# Patient Record
Sex: Female | Born: 1985 | Race: Black or African American | Hispanic: No | Marital: Married | Smoking: Never smoker
Health system: Southern US, Community
[De-identification: ages and names within clinical notes are randomized; demographics above are authoritative.]

## PROBLEM LIST (undated history)

## (undated) DIAGNOSIS — M3500 Sicca syndrome, unspecified: Secondary | ICD-10-CM

## (undated) DIAGNOSIS — M419 Scoliosis, unspecified: Secondary | ICD-10-CM

## (undated) DIAGNOSIS — K589 Irritable bowel syndrome without diarrhea: Secondary | ICD-10-CM

## (undated) DIAGNOSIS — D573 Sickle-cell trait: Secondary | ICD-10-CM

## (undated) DIAGNOSIS — E2839 Other primary ovarian failure: Secondary | ICD-10-CM

## (undated) DIAGNOSIS — O99019 Anemia complicating pregnancy, unspecified trimester: Secondary | ICD-10-CM

## (undated) DIAGNOSIS — D649 Anemia, unspecified: Secondary | ICD-10-CM

## (undated) DIAGNOSIS — F32A Depression, unspecified: Secondary | ICD-10-CM

## (undated) HISTORY — DX: Depression, unspecified: F32.A

## (undated) HISTORY — DX: Anemia, unspecified: D64.9

## (undated) HISTORY — PX: WISDOM TOOTH EXTRACTION: SHX21

## (undated) HISTORY — DX: Sjogren syndrome, unspecified: M35.00

## (undated) HISTORY — DX: Irritable bowel syndrome, unspecified: K58.9

## (undated) HISTORY — PX: SPINE SURGERY: SHX786

## (undated) HISTORY — DX: Scoliosis, unspecified: M41.9

## (undated) HISTORY — DX: Anemia complicating pregnancy, unspecified trimester: O99.019

## (undated) HISTORY — DX: Anemia complicating pregnancy, unspecified trimester: D57.3

## (undated) HISTORY — DX: Other primary ovarian failure: E28.39

---

## 2021-08-14 ENCOUNTER — Telehealth: Payer: Self-pay

## 2021-08-14 NOTE — Telephone Encounter (Signed)
Mar/LM for patient (to schedule MFM referral rec'd).

## 2021-08-21 ENCOUNTER — Other Ambulatory Visit: Payer: Self-pay

## 2021-08-28 ENCOUNTER — Telehealth: Payer: Self-pay

## 2021-08-28 NOTE — Telephone Encounter (Signed)
Mar/LM for patient requesting c/b to schedule MFM Consult & Gen Couns referral.

## 2021-08-28 NOTE — Telephone Encounter (Signed)
MAR/PATIENT CALLED BACK IN TO ADVISE SHE DOES NOT WISH TO SCHEDULE AND STATED TO PLS CANCEL THIS REFERRAL.

## 2021-08-29 ENCOUNTER — Telehealth: Payer: Self-pay

## 2021-08-29 NOTE — Telephone Encounter (Signed)
sw office and advised patient declined to schedule MFM referral.

## 2021-09-11 ENCOUNTER — Other Ambulatory Visit: Payer: Self-pay | Admitting: Obstetrics and Gynecology

## 2021-09-11 DIAGNOSIS — Z3689 Encounter for other specified antenatal screening: Secondary | ICD-10-CM

## 2021-09-12 ENCOUNTER — Other Ambulatory Visit: Payer: Self-pay | Admitting: Obstetrics and Gynecology

## 2021-09-12 DIAGNOSIS — O26879 Cervical shortening, unspecified trimester: Secondary | ICD-10-CM

## 2021-09-13 ENCOUNTER — Other Ambulatory Visit: Payer: Self-pay

## 2021-09-13 ENCOUNTER — Other Ambulatory Visit: Payer: Self-pay | Admitting: Obstetrics and Gynecology

## 2021-09-13 ENCOUNTER — Ambulatory Visit: Payer: 59 | Admitting: *Deleted

## 2021-09-13 ENCOUNTER — Ambulatory Visit: Payer: 59 | Attending: Obstetrics and Gynecology

## 2021-09-13 ENCOUNTER — Ambulatory Visit (HOSPITAL_BASED_OUTPATIENT_CLINIC_OR_DEPARTMENT_OTHER): Payer: 59 | Admitting: Obstetrics and Gynecology

## 2021-09-13 VITALS — BP 122/82 | HR 92

## 2021-09-13 DIAGNOSIS — Z3A22 22 weeks gestation of pregnancy: Secondary | ICD-10-CM | POA: Insufficient documentation

## 2021-09-13 DIAGNOSIS — O26879 Cervical shortening, unspecified trimester: Secondary | ICD-10-CM | POA: Insufficient documentation

## 2021-09-13 DIAGNOSIS — O09812 Supervision of pregnancy resulting from assisted reproductive technology, second trimester: Secondary | ICD-10-CM | POA: Insufficient documentation

## 2021-09-13 DIAGNOSIS — O26872 Cervical shortening, second trimester: Secondary | ICD-10-CM

## 2021-09-13 DIAGNOSIS — O30049 Twin pregnancy, dichorionic/diamniotic, unspecified trimester: Secondary | ICD-10-CM | POA: Insufficient documentation

## 2021-09-13 DIAGNOSIS — O3432 Maternal care for cervical incompetence, second trimester: Secondary | ICD-10-CM | POA: Insufficient documentation

## 2021-09-13 DIAGNOSIS — Z3689 Encounter for other specified antenatal screening: Secondary | ICD-10-CM | POA: Diagnosis present

## 2021-09-13 DIAGNOSIS — O30042 Twin pregnancy, dichorionic/diamniotic, second trimester: Secondary | ICD-10-CM

## 2021-09-13 NOTE — Progress Notes (Signed)
Maternal-Fetal Medicine   Name: Makayla [name] DOB: 1986/06/05 MRN: [id number] Referring Provider: Huel Cote, MD  I had the pleasure of seeing Makay[facility] [name] today at [facility]. She was accompanied by her husband. She is G1 P0 at 22w 1d gestation with twin pregnancy. At your office ultrasound performed on 09/05/21, cervical shortening (1.7 cm) was seen.  Rest of the fetal anatomical survey was reported as normal.  Patient reports she has been taking vaginal progesterone since the diagnosis of cervical insufficiency.  She does not have vaginal bleeding.  Patient conceived by IVF and 2 embryos were transferred.  GYN history: No history of abnormal Pap smears or cervical surgeries. Past medical history: Patient has Sjogren's syndrome. No history of diabetes or hypertension or any chronic medical conditions. She has sickle-cell trait. Social history: Denies tobacco or drug or alcohol use.  She has been married 3 years and her husband is in good health. Prenatal: Patient had initial prenatal care at [facility].  She reports screening for aneuploidies were inconclusive because of low fetal fraction.    Ultrasound We did not perform detailed fetal anatomical survey.  A limited ultrasound study was performed. We confirmed a dichorionic-diamniotic twin pregnancy.  Twin A: Lower fetus, cephalic presentation, anterior placenta, female fetus.  Amniotic fluid is normal good fetal activity seen.  Twin B: Upper fetus, transverse lie and head to maternal left, posterior placenta, female fetus.  Amniotic fluid is normal good fetal activity seen.    We performed a transvaginal ultrasound to evaluate the cervix.  The shortest and best resting cervical length measurement was 1.6 cm.  On transfundal pressure, the cervix shortened to 3 millimeters. Funneling was seen. After explaining, I performed a sterile speculum examination.  The external os is closed.  Vagina and cervix  appear normal.  On digital examination, the cervix is 1.5 cm long and the external os is closed.  Cervical insufficiency -I explained the findings with help of ultrasound images and diagrams.  Significant cervical shortening was seen on trendsfundal pressure.  I discussed the benefit of vaginal progesterone that has been shown to reduce the likelihood of preterm labor in singletons.    I informed that short cervix is associated with increased risk of mid-trimester pregnancy loss or preterm delivery and that both twin pregnancy and short cervix are risk factors for preterm delivery. Patient does not have symptoms of pelvic pressure or uterine contractions.  In singleton pregnancies, vaginal progesterone treatment women with short cervix and with no history of preterm delivery have shown to decrease preterm delivery rates. However, there is no clear evidence of its benefit in twin pregnancies. A meta-analysis (2015) from 13 trials in women with twin pregnancies with short cervix, perinatal outcome was better in the group that received vaginal progesterone.   Based on the studies that showed possible reduction in preterm delivery with vaginal progesterone treatment.  Till recently, we have been counseling patient that cerclage is not indicated. Limited studies have shown that cerclage does not confer benefit in prolonging the pregnancy and in some cases increase the risk of miscarriage. However, smaller studies have shown some benefit of rescue cerclage in prolongation of pregnancy. A recent metaanalysis showed that placement of rescue cerclage with dilated cervix greater than 10 millimeters showed prolongation of pregnancy by more than 6 weeks (Chunbo et al. Hubbard Robinson June 2019, pp. [phone]).  I counseled her that rescue cerclage is an option.  I explained the procedure and possible complications including  bleeding, infection, miscarriage and injuries to bladder or bowel (rare).  Continuation of vaginal  progesterone without cerclage is also a reasonable option.  Patient understands that cerclage does not guarantee carrying pregnancy to term. After counseling, the patient opted to have rescue cerclage procedure.  I counseled her that vaginal progesterone should be continued after cerclage.  I have not counseled her on twin pregnancy or Sjogren's syndrome in pregnancy. I discussed the findings and patient's request of rescue cerclage with Dr. Senaida Ores.  She will be arranging for the patient to have rescue cerclage procedure.  Recommendations -Rescue cerclage. -Follow-up ultrasound at your office.  Thank you for consultation.  If you have any questions or concerns, please contact me the [facility].  Consultation including face-to-face (more than 50%) counseling 30 minutes.

## 2021-09-15 NOTE — H&P (Signed)
Makayla [name] is an 36 y.o.prime female presenting at 45 4/7wks for a rescue cerclage.  Pt is dated per IVF transfer ( Dx with premature ovarian failure as a teenager). Carrying di -di twins. She begun care in our practice at [redacted]weeks gestation. Pt was noted to have a cervical length of 1.7cm at anatomy scan on 09/05/21. A repeat scan on 09/13/21 with MFM noted same with funneling to 24mm with pressure.  Pt was counseled extensively by MFM and subsequently by Dr Marvel Plan ( doc on call) and myself ( as I would be performing procedure).  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   Pertinent Gynecological History: Menses:  pregnant Bleeding: n/a OB History: G1, P0   Menstrual History: Menarche age: 21 Patient's last menstrual period was 04/25/2021.    Past Medical History:  Diagnosis Date   Anemia    Depression    IBS (irritable bowel syndrome)    Ovarian failure    Scoliosis    Sickle cell trait in mother affecting pregnancy ([place])    Sjogren's disease ([place])     Past Surgical History:  Procedure Laterality Date   SPINE SURGERY     WISDOM TOOTH EXTRACTION      Family History  Problem Relation Age of Onset   Hypertension Mother    Diabetes Mother    Bell's palsy Father    Hypertension Father     Social History:  reports that she has never smoked. She has never used smokeless tobacco. She reports that she does not currently use alcohol. She reports that she does not use drugs.  Allergies: No Known Allergies  No medications prior to admission.    Review of Systems  Constitutional:  Negative for activity change and fatigue.  Eyes:  Negative for photophobia and visual disturbance.  Gastrointestinal:  Negative for abdominal pain.  Genitourinary:  Negative for pelvic pain and urgency.  Musculoskeletal:  Negative for myalgias.   Psychiatric/Behavioral:  The patient is not nervous/anxious.    Last menstrual period 04/25/2021. Physical Exam Constitutional:      Appearance: Normal appearance.  Pulmonary:     Effort: Pulmonary effort is normal.  Musculoskeletal:        General: Normal range of motion.     Cervical back: Normal range of motion.  Skin:    General: Skin is warm.     Capillary Refill: Capillary refill takes 2 to 3 seconds.  Neurological:     General: No focal deficit present.     Mental Status: She is alert and oriented to person, place, and time.  Psychiatric:        Mood and Affect: Mood normal.        Behavior: Behavior normal.    No results found for this or any previous visit (from the past 24 hour(s)).  [place] MFM OB Transvaginal  Result Date: 09/13/2021 ----------------------------------------------------------------------  OBSTETRICS REPORT                       (Signed Final 09/13/2021 04:46 pm) ---------------------------------------------------------------------- Patient Info  ID #:       [id number]                          D.O.B.:  22-Aug-1986 (35 yrs)  Name:       Matthew Folks  Visit Date: 09/13/2021 12:55 pm ---------------------------------------------------------------------- Performed By  Attending:        Tama High MD        Ref. Address:     [address]                                                             [address] [place], [place]  Performed By:     Jeanene Erb BS,      [facility]                                                              [facility]  Referred By:      Paula Compton MD ---------------------------------------------------------------------- Orders  #  Description                           Code        Ordered By  1  [place] MFM OB LIMITED                     76815.01    [place]  2  [place] MFM OB TRANSVAGINAL                [phone]     KATHY RICHARDSON ----------------------------------------------------------------------  #  Order #                     Accession #                Episode #  1  [id number]                   [id number]                 [id number]  2  [id number]                   [id number]                 [id number] ---------------------------------------------------------------------- Indications  Twin pregnancy, di/di, second trimester        O30.042  Cervical incompetence, second trimester        O34.32  [redacted] weeks gestation of pregnancy                Z3A.22  Encounter for antenatal screening for          Z36.3  malformations  Genetic carrier Network engineer)                   Z14.8  Pregnancy resulting from assisted              O43.819  reproductive technology ---------------------------------------------------------------------- Fetal Evaluation (Fetus A)  Num Of Fetuses:         2  Fetal Heart Rate(bpm):  153  Cardiac Activity:       Observed  Fetal Lie:              Lower Fetus  Presentation:           Cephalic  Placenta:               Anterior  P. Cord Insertion:      Visualized  Membrane Desc:      Dividing Membrane seen - Dichorionic.  Amniotic Fluid  AFI FV:      Within normal limits                              Largest Pocket(cm)                              5.3 ---------------------------------------------------------------------- OB History  Gravidity:    1 ---------------------------------------------------------------------- Gestational Age (Fetus A)  LMP:           20w 1d        Date:  04/25/21  EDD:    01/30/22  Best:          22w 1d     Det. ByLoman Chroman         EDD:   01/16/22                                      (08/07/21) ---------------------------------------------------------------------- Anatomy (Fetus A)  Diaphragm:             Appears normal         Abdomen:                Appears normal  Stomach:               Appears normal, left   Bladder:                Appears normal                         sided ---------------------------------------------------------------------- Fetal Evaluation (Fetus B)  Num Of Fetuses:         2  Fetal Heart Rate(bpm):  141  Cardiac Activity:       Observed  Fetal Lie:              Upper Fetus  Presentation:           Transverse, head to maternal left  Placenta:               Posterior  P. Cord Insertion:      Visualized  Membrane Desc:      Dividing Membrane seen - Dichorionic.  Amniotic Fluid  AFI FV:      Within normal limits                              Largest Pocket(cm)                              8.5 ---------------------------------------------------------------------- Gestational Age (Fetus B)  LMP:           20w 1d        Date:  04/25/21                 EDD:   01/30/22  Best:          22w 1d     Det. By:  Loman Chroman         EDD:   01/16/22                                      (08/07/21) ---------------------------------------------------------------------- Anatomy (Fetus B)  Diaphragm:             Appears normal         Abdomen:                Appears normal  Stomach:               Appears normal, left   Bladder:                Appears normal  sided ---------------------------------------------------------------------- Cervix Uterus Adnexa  Cervix  Length:            0.3  cm.  Measured transvaginally. ---------------------------------------------------------------------- Impression  We did not perform detailed fetal anatomical survey.  A  limited ultrasound study was performed.  We confirmed a dichorionic-diamniotic twin  pregnancy.  Twin A: Lower fetus, cephalic presentation, anterior placenta,  female fetus.  Amniotic fluid is normal good fetal activity seen.  Twin B: Upper fetus, transverse lie and head to maternal left,  posterior placenta, female fetus.  Amniotic fluid is normal good  fetal activity seen.  We performed a transvaginal ultrasound to evaluate the  cervix.  The shortest and best resting cervical length  measurement was 1.6 cm.  On transfundal pressure, the  cervix shortened to 3 millimeters. Funneling was seen.  After explaining, I performed a sterile speculum examination.  The external os is closed.  Vagina and cervix appear normal.  On digital examination, the cervix is 1.5 cm long and the  external os is closed.  xxxxxxxxxxxxxxxxxxxxxxxxxxxxxxxxxxxxxxxxxxxxxxxxxxxxxxxxx  xx  Consultation (see EPIC )  I had the pleasure of seeing Ms. Cremeans today at the [facility]. She was accompanied by her  husband. She is G1 P0 at 22w 1d gestation with twin  pregnancy. At your office ultrasound performed on 09/05/21,  cervical shortening (1.7 cm) was seen.  Rest of the fetal  anatomical survey was reported as normal.  Patient reports she has been taking vaginal progesterone  since the diagnosis of cervical insufficiency.  She does not  have vaginal bleeding.  Patient conceived by IVF and 2 embryos were transferred.  GYN history: No history of abnormal Pap smears or cervical  surgeries.  Past medical history: Patient has Sjogren's syndrome. No  history of diabetes or hypertension or any chronic medical  conditions. She has sickle-cell trait.  Social history: Denies tobacco or drug or alcohol use.  She  has been married 3 years and her husband is in good health.  Prenatal: Patient had initial prenatal care at [facility].  She reports screening for aneuploidies were  inconclusive because of low fetal fraction.  Cervical insufficiency  -I explained the findings with help of ultrasound images and   diagrams.  Significant cervical shortening was seen on  trendsfundal pressure.  I discussed the benefit of vaginal  progesterone that has been shown to reduce the likelihood of  preterm labor in singletons.  I informed that short cervix is associated with increased risk  of mid-trimester pregnancy loss or preterm delivery and that  both twin pregnancy and short cervix are risk factors for  preterm delivery. Patient does not have symptoms of pelvic  pressure or uterine contractions.  In singleton pregnancies, vaginal progesterone treatment  women with short cervix and with no history of preterm  delivery have shown to decrease preterm delivery rates.  However, there is no clear evidence of its benefit in twin  pregnancies. A meta-analysis (2015) from 13 trials in women  with twin pregnancies with short cervix, perinatal outcome  was better in the group that received vaginal progesterone.  Based on the studies that showed possible reduction in  preterm delivery with vaginal progesterone treatment.  Till recently, we have been counseling patient that cerclage is  not indicated. Limited studies have shown that cerclage does  not confer benefit in prolonging the pregnancy and in some  cases increase the risk of miscarriage. However, smaller  studies have shown some  benefit of rescue cerclage in  prolongation of pregnancy. A recent metaanalysis showed  that placement of rescue cerclage with dilated cervix greater  than 10 millimeters showed prolongation of pregnancy by  more than 6 weeks (Chunbo et al. Dicie Beam June 2019, pp. 543-  557).  I counseled her that rescue cerclage is an option.  I explained  the procedure and possible complications including bleeding,  infection, miscarriage and injuries to bladder or bowel (rare).  Continuation of vaginal progesterone without cerclage is also  a reasonable option.  Patient understands that cerclage does not guarantee  carrying pregnancy to term.  After counseling, the patient  opted to have rescue cerclage  procedure.  I counseled her that vaginal progesterone should  be continued after cerclage.  I have not counseled her on twin pregnancy or Sjogren's  syndrome in pregnancy.  I discussed the findings and patient's request of rescue  cerclage with Dr. Marvel Plan.  She will be arranging for the  patient to have rescue cerclage procedure. ---------------------------------------------------------------------- Recommendations  -Rescue cerclage.  -Follow-up ultrasound at your office. ----------------------------------------------------------------------                  Tama High, MD Electronically Signed Final Report   09/13/2021 04:46 pm ----------------------------------------------------------------------  [place] MFM OB LIMITED  Result Date: 09/13/2021 ----------------------------------------------------------------------  OBSTETRICS REPORT                       (Signed Final 09/13/2021 04:46 pm) ---------------------------------------------------------------------- Patient Info  ID #:       [id number]                          D.O.B.:  1986-06-21 (35 yrs)  Name:       MAKAYLLA SANTORO                 Visit Date: 09/13/2021 12:55 pm ---------------------------------------------------------------------- Performed By  Attending:        Tama High MD        Ref. Address:     [address],  [address] [place], [place]  Performed By:     Jeanene Erb BS,      [facility]                                     [facility] at                                                              [place] [facility]                                                             [facility]  Referred By:      Paula Compton MD ---------------------------------------------------------------------- Orders  #  Description                           Code        Ordered By  1  [place] MFM OB LIMITED                     76815.01    [place]  2  [place] MFM OB TRANSVAGINAL                [id number]     KATHY RICHARDSON ----------------------------------------------------------------------  #  Order #                     Accession #                Episode #  1  [id number]                   [id number]  2  [id number]                   [id number]                 [id number] ---------------------------------------------------------------------- Indications  Twin pregnancy,  di/di, second trimester        O30.042  Cervical incompetence, second trimester        O34.32  [redacted] weeks gestation of pregnancy                Z3A.22  Encounter for antenatal screening for          Z36.3  malformations  Genetic carrier Network engineer)                   Z14.8  Pregnancy resulting from assisted              O71.819  reproductive technology ---------------------------------------------------------------------- Fetal Evaluation (Fetus A)  Num Of Fetuses:         2  Fetal Heart Rate(bpm):  153  Cardiac Activity:       Observed  Fetal Lie:              Lower Fetus  Presentation:           Cephalic  Placenta:               Anterior  P. Cord Insertion:      Visualized  Membrane Desc:      Dividing Membrane seen - Dichorionic.  Amniotic Fluid  AFI FV:      Within normal limits                              Largest Pocket(cm)                              5.3 ---------------------------------------------------------------------- OB History  Gravidity:    1 ---------------------------------------------------------------------- Gestational Age (Fetus A)  LMP:           20w 1d        Date:  04/25/21                  EDD:   01/30/22  Best:          22w 1d     Det. ByLoman Chroman         EDD:   01/16/22                                      (08/07/21) ---------------------------------------------------------------------- Anatomy (Fetus A)  Diaphragm:             Appears normal         Abdomen:                Appears normal  Stomach:               Appears normal, left   Bladder:                Appears normal                         sided ---------------------------------------------------------------------- Fetal Evaluation (Fetus B)  Num Of Fetuses:         2  Fetal Heart Rate(bpm):  141  Cardiac Activity:       Observed  Fetal Lie:              Upper Fetus  Presentation:           Transverse, head to maternal left  Placenta:               Posterior  P. Cord Insertion:      Visualized  Membrane Desc:      Dividing Membrane seen - Dichorionic.  Amniotic Fluid  AFI FV:      Within normal limits                              Largest Pocket(cm)                              8.5 ---------------------------------------------------------------------- Gestational Age (Fetus B)  LMP:           20w 1d        Date:  04/25/21                 EDD:   01/30/22  Best:          22w 1d     Det. By:  Loman Chroman         EDD:   01/16/22                                      (08/07/21) ---------------------------------------------------------------------- Anatomy (Fetus B)  Diaphragm:             Appears normal         Abdomen:                Appears normal  Stomach:               Appears normal, left   Bladder:                Appears normal                         sided ---------------------------------------------------------------------- Cervix Uterus Adnexa  Cervix  Length:            0.3  cm.  Measured transvaginally. ---------------------------------------------------------------------- Impression  We did not perform detailed fetal anatomical survey.  A  limited ultrasound study was performed.  We confirmed a  dichorionic-diamniotic twin pregnancy.  Twin A: Lower fetus, cephalic presentation, anterior placenta,  female fetus.  Amniotic fluid is normal good fetal activity seen.  Twin B: Upper fetus, transverse lie and head to maternal left,  posterior placenta, female fetus.  Amniotic fluid is normal good  fetal activity seen.  We performed a transvaginal ultrasound to evaluate the  cervix.  The shortest and best resting cervical length  measurement was 1.6 cm.  On transfundal pressure, the  cervix shortened to 3 millimeters. Funneling was seen.  After explaining, I performed a sterile speculum examination.  The external os is closed.  Vagina and cervix appear normal.  On digital examination, the cervix is 1.5 cm long and the  external os is closed.  xxxxxxxxxxxxxxxxxxxxxxxxxxxxxxxxxxxxxxxxxxxxxxxxxxxxxxxxx  xx  Consultation (see EPIC )  I had the pleasure of seeing Ms. Gorley today at the [facility]. She was accompanied by her  husband. She is G1 P0 at 22w 1d gestation with twin  pregnancy. At your office ultrasound performed on 09/05/21,  cervical shortening (1.7 cm) was seen.  Rest of the fetal  anatomical survey was reported as normal.  Patient reports she has been taking  vaginal progesterone  since the diagnosis of cervical insufficiency.  She does not  have vaginal bleeding.  Patient conceived by IVF and 2 embryos were transferred.  GYN history: No history of abnormal Pap smears or cervical  surgeries.  Past medical history: Patient has Sjogren's syndrome. No  history of diabetes or hypertension or any chronic medical  conditions. She has sickle-cell trait.  Social history: Denies tobacco or drug or alcohol use.  She  has been married 3 years and her husband is in good health.  Prenatal: Patient had initial prenatal care at [facility].  She reports screening for aneuploidies were  inconclusive because of low fetal fraction.  Cervical insufficiency  -I explained the findings with help of  ultrasound images and  diagrams.  Significant cervical shortening was seen on  trendsfundal pressure.  I discussed the benefit of vaginal  progesterone that has been shown to reduce the likelihood of  preterm labor in singletons.  I informed that short cervix is associated with increased risk  of mid-trimester pregnancy loss or preterm delivery and that  both twin pregnancy and short cervix are risk factors for  preterm delivery. Patient does not have symptoms of pelvic  pressure or uterine contractions.  In singleton pregnancies, vaginal progesterone treatment  women with short cervix and with no history of preterm  delivery have shown to decrease preterm delivery rates.  However, there is no clear evidence of its benefit in twin  pregnancies. A meta-analysis (2015) from 13 trials in women  with twin pregnancies with short cervix, perinatal outcome  was better in the group that received vaginal progesterone.  Based on the studies that showed possible reduction in  preterm delivery with vaginal progesterone treatment.  Till recently, we have been counseling patient that cerclage is  not indicated. Limited studies have shown that cerclage does  not confer benefit in prolonging the pregnancy and in some  cases increase the risk of miscarriage. However, smaller  studies have shown some benefit of rescue cerclage in  prolongation of pregnancy. A recent metaanalysis showed  that placement of rescue cerclage with dilated cervix greater  than 10 millimeters showed prolongation of pregnancy by  more than 6 weeks (Chunbo et al. Dicie Beam June 2019, pp. 543-  557).  I counseled her that rescue cerclage is an option.  I explained  the procedure and possible complications including bleeding,  infection, miscarriage and injuries to bladder or bowel (rare).  Continuation of vaginal progesterone without cerclage is also  a reasonable option.  Patient understands that cerclage does not guarantee  carrying pregnancy to term.  After  counseling, the patient opted to have rescue cerclage  procedure.  I counseled her that vaginal progesterone should  be continued after cerclage.  I have not counseled her on twin pregnancy or Sjogren's  syndrome in pregnancy.  I discussed the findings and patient's request of rescue  cerclage with Dr. Marvel Plan.  She will be arranging for the  patient to have rescue cerclage procedure. ---------------------------------------------------------------------- Recommendations  -Rescue cerclage.  -Follow-up ultrasound at your office. ----------------------------------------------------------------------                  Tama High, MD Electronically Signed Final Report   09/13/2021 04:46 pm ----------------------------------------------------------------------   Assessment/Plan: 35yo Prime at 13 4/7wks per IVF transfer date with incompetent cervix here for rescue cerclage - Check FHTs prior to and after procedure - ERAS protocol - Risks reviewed and consent obtained   Aylah Yeary W  Rayhaan Huster 09/15/2021, 1:20 PM

## 2021-09-16 ENCOUNTER — Encounter (HOSPITAL_COMMUNITY): Payer: Self-pay | Admitting: Obstetrics and Gynecology

## 2021-09-16 ENCOUNTER — Encounter (HOSPITAL_COMMUNITY)
Admission: AD | Disposition: A | Discharge status: Home / Self Care | Payer: Self-pay | Source: Home / Self Care | Attending: Obstetrics and Gynecology

## 2021-09-16 ENCOUNTER — Inpatient Hospital Stay (HOSPITAL_COMMUNITY): Payer: 59 | Admitting: Anesthesiology

## 2021-09-16 ENCOUNTER — Ambulatory Visit (HOSPITAL_COMMUNITY)
Admission: AD | Admit: 2021-09-16 | Discharge: 2021-09-16 | Disposition: A | Discharge status: Home / Self Care | Payer: 59 | Attending: Obstetrics and Gynecology | Admitting: Obstetrics and Gynecology

## 2021-09-16 ENCOUNTER — Other Ambulatory Visit: Payer: Self-pay

## 2021-09-16 DIAGNOSIS — O30042 Twin pregnancy, dichorionic/diamniotic, second trimester: Secondary | ICD-10-CM | POA: Diagnosis not present

## 2021-09-16 DIAGNOSIS — O99012 Anemia complicating pregnancy, second trimester: Secondary | ICD-10-CM | POA: Insufficient documentation

## 2021-09-16 DIAGNOSIS — N883 Incompetence of cervix uteri: Secondary | ICD-10-CM

## 2021-09-16 DIAGNOSIS — Z3A22 22 weeks gestation of pregnancy: Secondary | ICD-10-CM | POA: Diagnosis not present

## 2021-09-16 DIAGNOSIS — M3500 Sicca syndrome, unspecified: Secondary | ICD-10-CM | POA: Diagnosis not present

## 2021-09-16 DIAGNOSIS — O3432 Maternal care for cervical incompetence, second trimester: Secondary | ICD-10-CM | POA: Insufficient documentation

## 2021-09-16 HISTORY — PX: CERVICAL CERCLAGE: SHX1329

## 2021-09-16 LAB — TYPE AND SCREEN
ABO/RH(D): O POS
Antibody Screen: NEGATIVE

## 2021-09-16 LAB — CBC
HCT: 33.6 % — ABNORMAL LOW (ref 36.0–46.0)
Hemoglobin: 11.1 g/dL — ABNORMAL LOW (ref 12.0–15.0)
MCH: 27 pg (ref 26.0–34.0)
MCHC: 33 g/dL (ref 30.0–36.0)
MCV: 81.8 fL (ref 80.0–100.0)
Platelets: 390 10*3/uL (ref 150–400)
RBC: 4.11 MIL/uL (ref 3.87–5.11)
RDW: 13.4 % (ref 11.5–15.5)
WBC: 7.9 10*3/uL (ref 4.0–10.5)
nRBC: 0 % (ref 0.0–0.2)

## 2021-09-16 SURGERY — CERCLAGE, CERVIX, VAGINAL APPROACH
Anesthesia: Spinal

## 2021-09-16 MED ORDER — ACETAMINOPHEN 10 MG/ML IV SOLN: 1000.0000 mg | Freq: Once | INTRAVENOUS | Status: DC | PRN

## 2021-09-16 MED ORDER — FENTANYL CITRATE (PF) 100 MCG/2ML IJ SOLN
INTRAMUSCULAR | Status: DC | PRN
  Administered 2021-09-16: 25 ug via INTRAVENOUS

## 2021-09-16 MED ORDER — LIDOCAINE 2% (20 MG/ML) 5 ML SYRINGE
INTRAMUSCULAR | Status: DC | PRN
  Administered 2021-09-16: 60 mg via INTRAVENOUS

## 2021-09-16 MED ORDER — ACETAMINOPHEN 500 MG PO TABS
ORAL_TABLET | ORAL | Status: AC
  Filled 2021-09-16: qty 2

## 2021-09-16 MED ORDER — PHENYLEPHRINE HCL (PRESSORS) 10 MG/ML IV SOLN
INTRAVENOUS | Status: DC | PRN
  Administered 2021-09-16: 40 ug via INTRAVENOUS

## 2021-09-16 MED ORDER — ACETAMINOPHEN 500 MG PO TABS
1000.0000 mg | ORAL_TABLET | ORAL | Status: AC
  Administered 2021-09-16: 1000 mg via ORAL

## 2021-09-16 MED ORDER — LIDOCAINE 2% (20 MG/ML) 5 ML SYRINGE
INTRAMUSCULAR | Status: AC
  Filled 2021-09-16: qty 5

## 2021-09-16 MED ORDER — LACTATED RINGERS IV SOLN: INTRAVENOUS | Status: DC

## 2021-09-16 MED ORDER — ONDANSETRON HCL 4 MG/2ML IJ SOLN
INTRAMUSCULAR | Status: DC | PRN
  Administered 2021-09-16: 4 mg via INTRAVENOUS

## 2021-09-16 MED ORDER — SOD CITRATE-CITRIC ACID 500-334 MG/5ML PO SOLN
ORAL | Status: AC
  Filled 2021-09-16: qty 30

## 2021-09-16 MED ORDER — SOD CITRATE-CITRIC ACID 500-334 MG/5ML PO SOLN
30.0000 mL | ORAL | Status: AC
  Administered 2021-09-16: 30 mL via ORAL

## 2021-09-16 MED ORDER — OXYCODONE-ACETAMINOPHEN 5-325 MG PO TABS: 1.0000 | ORAL_TABLET | Freq: Four times a day (QID) | ORAL | 0 refills | Status: AC | PRN

## 2021-09-16 MED ORDER — PROPOFOL 10 MG/ML IV BOLUS
INTRAVENOUS | Status: AC
  Filled 2021-09-16: qty 20

## 2021-09-16 MED ORDER — FENTANYL CITRATE (PF) 100 MCG/2ML IJ SOLN
INTRAMUSCULAR | Status: AC
  Filled 2021-09-16: qty 2

## 2021-09-16 MED ORDER — ONDANSETRON HCL 4 MG PO TABS: 4.0000 mg | ORAL_TABLET | Freq: Four times a day (QID) | ORAL | Status: DC | PRN

## 2021-09-16 MED ORDER — PHENYLEPHRINE 40 MCG/ML (10ML) SYRINGE FOR IV PUSH (FOR BLOOD PRESSURE SUPPORT)
PREFILLED_SYRINGE | INTRAVENOUS | Status: AC
  Filled 2021-09-16: qty 10

## 2021-09-16 MED ORDER — PROPOFOL 10 MG/ML IV BOLUS
INTRAVENOUS | Status: DC | PRN
Start: 2021-09-16 — End: 2021-09-16
  Administered 2021-09-16: 200 mg via INTRAVENOUS

## 2021-09-16 MED ORDER — ONDANSETRON HCL 4 MG/2ML IJ SOLN: 4.0000 mg | Freq: Four times a day (QID) | INTRAMUSCULAR | Status: DC | PRN

## 2021-09-16 MED ORDER — CHLOROPROCAINE HCL 50 MG/5ML IT SOLN
INTRATHECAL | Status: AC
  Filled 2021-09-16: qty 5

## 2021-09-16 MED ORDER — FENTANYL CITRATE (PF) 100 MCG/2ML IJ SOLN: 25.0000 ug | INTRAMUSCULAR | Status: DC | PRN

## 2021-09-16 MED ORDER — ONDANSETRON HCL 4 MG/2ML IJ SOLN
INTRAMUSCULAR | Status: AC
  Filled 2021-09-16: qty 2

## 2021-09-16 MED ORDER — OXYCODONE HCL 5 MG PO TABS: 5.0000 mg | ORAL_TABLET | ORAL | Status: DC | PRN

## 2021-09-16 MED ORDER — SODIUM CHLORIDE 0.9 % IR SOLN
Status: DC | PRN
  Administered 2021-09-16: 1

## 2021-09-16 MED ORDER — POVIDONE-IODINE 10 % EX SWAB
2.0000 "application " | Freq: Once | CUTANEOUS | Status: AC
  Administered 2021-09-16: 2 via TOPICAL

## 2021-09-16 MED ORDER — ACETAMINOPHEN 500 MG PO TABS
1000.0000 mg | ORAL_TABLET | Freq: Four times a day (QID) | ORAL | Status: DC
  Administered 2021-09-16: 1000 mg via ORAL

## 2021-09-16 MED ORDER — SOD CITRATE-CITRIC ACID 500-334 MG/5ML PO SOLN
30.0000 mL | Freq: Once | ORAL | Status: AC
  Administered 2021-09-16: 30 mL via ORAL

## 2021-09-16 MED ORDER — KETOROLAC TROMETHAMINE 30 MG/ML IJ SOLN: 30.0000 mg | Freq: Once | INTRAMUSCULAR | Status: DC

## 2021-09-16 MED ORDER — SOD CITRATE-CITRIC ACID 500-334 MG/5ML PO SOLN: 30.0000 mL | Freq: Every day | ORAL | 0 refills | Status: DC

## 2021-09-16 SURGICAL SUPPLY — 15 items
CANISTER SUCT 3000ML PPV (MISCELLANEOUS) ×2 IMPLANT
GLOVE BIO SURGEON STRL SZ 6.5 (GLOVE) ×4 IMPLANT
GLOVE BIOGEL PI IND STRL 7.0 (GLOVE) ×1 IMPLANT
GLOVE BIOGEL PI INDICATOR 7.0 (GLOVE) ×1
GOWN STRL REUS W/TWL LRG LVL3 (GOWN DISPOSABLE) ×4 IMPLANT
NDL MAYO CATGUT SZ4 TPR NDL (NEEDLE) ×1 IMPLANT
NEEDLE MAYO CATGUT SZ4 (NEEDLE) ×2 IMPLANT
PACK VAGINAL MINOR WOMEN LF (CUSTOM PROCEDURE TRAY) ×2 IMPLANT
PAD OB MATERNITY 4.3X12.25 (PERSONAL CARE ITEMS) ×2 IMPLANT
PAD PREP 24X48 CUFFED NSTRL (MISCELLANEOUS) ×2 IMPLANT
SUT POLYDEK 5 CE 75 36 (SUTURE) ×4 IMPLANT
TOWEL OR 17X24 6PK STRL BLUE (TOWEL DISPOSABLE) ×4 IMPLANT
TRAY FOLEY W/BAG SLVR 14FR (SET/KITS/TRAYS/PACK) ×2 IMPLANT
TUBING NON-CON 1/4 X 20 CONN (TUBING) ×2 IMPLANT
YANKAUER SUCT BULB TIP NO VENT (SUCTIONS) ×2 IMPLANT

## 2021-09-16 NOTE — Op Note (Signed)
Operative Note    Preoperative Diagnosis: Incompetent cervix Twin di-di pregnancy at 22 4/7wks    Postoperative Diagnosis: Same    Procedure: Rescue cervical cerclage   Surgeon: Mickle Mallory DO   Anesthesia: General   Fluids: LR 1447ml EBL: 54ml UOP: 211ml   Findings:: Short cervix 1.5cm, closed   Specimen: None   Procedure Note  Pt taken to OR and spinal anesthesia attempted. Procedure aborted after several attempts due to rods in her back from scoliosis surgery. Pt offered  general anesthesia which she agreed to.  Pt placed in dorsal lithotomy position and appropriate time out done. General anesthesia administered without complications. Pt was prepped with betadine her bladder was drained of 240ml urine and she was draped in sterile fashion. Exam under anesthesia confirmed cervix still closed.  Speculum placed and cervix visualized.  0 mersilene suture placed circumferentially with traction provided using a ring forceps. Knot placed at 11 o'clock. Cervical length too short for a second suture. No bleeding noted at end of procedure. All instruments then removed from pts vagina. Instrument and sponge counts correct. Pt to recovery room in stable condition. Tolerated procedure well  FHTs obtained prior to and after procedure

## 2021-09-16 NOTE — Transfer of Care (Signed)
Immediate Anesthesia Transfer of Care Note  Patient: Adorabella Resendiz  Procedure(s) Performed: CERCLAGE CERVICAL  Patient Location: PACU  Anesthesia Type:General  Level of Consciousness: awake, alert  and oriented  Airway & Oxygen Therapy: Patient Spontanous Breathing  Post-op Assessment: Report given to RN and Post -op Vital signs reviewed and stable  Post vital signs: Reviewed and stable  Last Vitals:  Vitals Value Taken Time  BP 129/90 09/16/21 1030  Temp 36.5 C 09/16/21 1022  Pulse 76 09/16/21 1034  Resp 18 09/16/21 1034  SpO2 100 % 09/16/21 1034  Vitals shown include unvalidated device data.  Last Pain:  Vitals:   09/16/21 1022  TempSrc: Oral      Patients Stated Pain Goal: 1 ([id number])  Complications: No notable events documented.

## 2021-09-16 NOTE — Anesthesia Preprocedure Evaluation (Addendum)
Anesthesia Evaluation  Patient identified by MRN, date of birth, ID band Patient awake    Reviewed: Allergy & Precautions, NPO status , Patient's Chart, lab work & pertinent test results  Airway Mallampati: II  TM Distance: >3 FB Neck ROM: Full    Dental no notable dental hx.    Pulmonary neg pulmonary ROS,    Pulmonary exam normal        Cardiovascular negative cardio ROS   Rhythm:Regular Rate:Normal     Neuro/Psych Depression negative neurological ROS     GI/Hepatic negative GI ROS, Neg liver ROS,   Endo/Other  negative endocrine ROS  Renal/GU negative Renal ROS  negative genitourinary   Musculoskeletal Scoliosis with prior extensive surgery with hardware.   Abdominal Normal abdominal exam  (+)   Peds  Hematology  (+) anemia ,   Anesthesia Other Findings Midline spine incision from ~T3-pelvis. Patient reports prior scoliosis surgery with hardware but unsure exactly what level. Records not in system.  Reproductive/Obstetrics (+) Pregnancy Twins                             Anesthesia Physical Anesthesia Plan  ASA: 2  Anesthesia Plan: Spinal   Post-op Pain Management:    Induction: Intravenous  PONV Risk Score and Plan: 2 and Ondansetron, Dexamethasone and Treatment may vary due to age or medical condition  Airway Management Planned: Simple Face Mask, Natural Airway and Nasal Cannula  Additional Equipment: None  Intra-op Plan:   Post-operative Plan:   Informed Consent: I have reviewed the patients History and Physical, chart, labs and discussed the procedure including the risks, benefits and alternatives for the proposed anesthesia with the patient or authorized representative who has indicated his/her understanding and acceptance.     Dental advisory given  Plan Discussed with: CRNA  Anesthesia Plan Comments: (Given patient's extensive previous spine surgery for  scoliosis, general in addition to spinal anesthesia discussed extensively with patient as possibility. She is amenable to attempts at spinal anesthetic with the understanding if unsuccessful, will plan for GA.   Lab Results      Component                Value               Date                      WBC                      7.9                 09/16/2021                HGB                      11.1 (L)            09/16/2021                HCT                      33.6 (L)            09/16/2021                MCV  81.8                09/16/2021                PLT                      390                 09/16/2021          )       Anesthesia Quick Evaluation

## 2021-09-16 NOTE — Anesthesia Postprocedure Evaluation (Signed)
Anesthesia Post Note  Patient: Makayla [name]  Procedure(s) Performed: CERCLAGE CERVICAL     Patient location during evaluation: PACU Anesthesia Type: General Level of consciousness: awake and alert Pain management: pain level controlled Vital Signs Assessment: post-procedure vital signs reviewed and stable Respiratory status: spontaneous breathing, nonlabored ventilation, respiratory function stable and patient connected to nasal cannula oxygen Cardiovascular status: blood pressure returned to baseline and stable Postop Assessment: no apparent nausea or vomiting Anesthetic complications: no   No notable events documented.  Last Vitals:  Vitals:   09/16/21 1110 09/16/21 1115  BP:  118/85  Pulse: 78 77  Resp: 16 19  Temp:    SpO2: 98% 96%    Last Pain:  Vitals:   09/16/21 1115  TempSrc:   PainSc: Asleep                 Nelle Don Yareth Kearse

## 2021-09-16 NOTE — Anesthesia Procedure Notes (Signed)
Spinal  Patient location during procedure: OR Start time: 09/16/2021 9:20 AM End time: 09/16/2021 9:40 AM Staffing Performed: anesthesiologist  Anesthesiologist: Atilano Median, DO Preanesthetic Checklist Completed: patient identified, IV checked, site marked, risks and benefits discussed, surgical consent, monitors and equipment checked, pre-op evaluation and timeout performed Spinal Block Patient position: sitting Prep: DuraPrep Patient monitoring: heart rate, cardiac monitor, continuous pulse ox and blood pressure Approach: midline Location: L3-4 Injection technique: single-shot Needle Needle type: Pencan, Quincke and Tuohy  Needle gauge: 24 G Needle length: 10 cm Assessment Events: failed spinal Additional Notes Patient with notable extensive prior spine instrumentation for scoliosis with midline incision present ~T3-pelvis. No prior imaging or notes available for review. Multiple spinal attempts failed despite different needles and approach techniques.  Patient identified. Risks/Benefits/Options discussed with patient including but not limited to bleeding, infection, nerve damage, paralysis, failed block, incomplete pain control, headache, blood pressure changes, nausea, vomiting, reactions to medications, itching and postpartum back pain. Confirmed with bedside nurse the patient's most recent platelet count. Confirmed with patient that they are not currently taking any anticoagulation, have any bleeding history or any family history of bleeding disorders. Patient expressed understanding and wished to proceed. All questions were answered. Sterile technique was used throughout the entire procedure. Please see nursing notes for vital signs. Warning signs of high block given to the patient including shortness of breath, tingling/numbness in hands, complete motor block, or any concerning symptoms with instructions to call for help. Patient was given instructions on fall risk and not to  get out of bed. All questions and concerns addressed with instructions to call with any issues or inadequate analgesia.

## 2021-09-16 NOTE — Interval H&P Note (Signed)
History and Physical Interval Note: Pt seen. Risks/benefits reiterated No change since H/P done Consent verified To OR when ready   09/16/2021 8:30 AM  Makayla [name]  has presented today for surgery, with the diagnosis of incomp cervixc, twins , 22wk 4 days.  The various methods of treatment have been discussed with the patient and family. After consideration of risks, benefits and other options for treatment, the patient has consented to  Procedure(s): CERCLAGE CERVICAL (N/A) as a surgical intervention.  The patient's history has been reviewed, patient examined, no change in status, stable for surgery.  I have reviewed the patient's chart and labs.  Questions were answered to the patient's satisfaction.     Cathrine Muster

## 2021-09-16 NOTE — Discharge Instructions (Signed)
Call office with any concerns [phone] 

## 2021-10-01 ENCOUNTER — Telehealth: Payer: Self-pay

## 2021-10-18 ENCOUNTER — Inpatient Hospital Stay (HOSPITAL_COMMUNITY)
Admission: AD | Admit: 2021-10-18 | Discharge: 2021-10-25 | DRG: 786 | Disposition: A | Discharge status: Home / Self Care | Payer: 59 | Attending: Obstetrics and Gynecology | Admitting: Obstetrics and Gynecology

## 2021-10-18 ENCOUNTER — Other Ambulatory Visit: Payer: Self-pay

## 2021-10-18 ENCOUNTER — Encounter (HOSPITAL_COMMUNITY): Payer: Self-pay | Admitting: Obstetrics and Gynecology

## 2021-10-18 ENCOUNTER — Inpatient Hospital Stay (HOSPITAL_BASED_OUTPATIENT_CLINIC_OR_DEPARTMENT_OTHER): Payer: 59

## 2021-10-18 DIAGNOSIS — O09812 Supervision of pregnancy resulting from assisted reproductive technology, second trimester: Secondary | ICD-10-CM | POA: Diagnosis not present

## 2021-10-18 DIAGNOSIS — O26872 Cervical shortening, second trimester: Secondary | ICD-10-CM | POA: Diagnosis present

## 2021-10-18 DIAGNOSIS — M3500 Sicca syndrome, unspecified: Secondary | ICD-10-CM | POA: Diagnosis present

## 2021-10-18 DIAGNOSIS — K219 Gastro-esophageal reflux disease without esophagitis: Secondary | ICD-10-CM | POA: Diagnosis present

## 2021-10-18 DIAGNOSIS — O99892 Other specified diseases and conditions complicating childbirth: Secondary | ICD-10-CM | POA: Diagnosis present

## 2021-10-18 DIAGNOSIS — O3432 Maternal care for cervical incompetence, second trimester: Secondary | ICD-10-CM | POA: Diagnosis present

## 2021-10-18 DIAGNOSIS — O30042 Twin pregnancy, dichorionic/diamniotic, second trimester: Secondary | ICD-10-CM | POA: Diagnosis present

## 2021-10-18 DIAGNOSIS — Z23 Encounter for immunization: Secondary | ICD-10-CM

## 2021-10-18 DIAGNOSIS — O26893 Other specified pregnancy related conditions, third trimester: Secondary | ICD-10-CM | POA: Diagnosis present

## 2021-10-18 DIAGNOSIS — M419 Scoliosis, unspecified: Secondary | ICD-10-CM | POA: Diagnosis present

## 2021-10-18 DIAGNOSIS — O9962 Diseases of the digestive system complicating childbirth: Secondary | ICD-10-CM | POA: Diagnosis present

## 2021-10-18 DIAGNOSIS — Z3A27 27 weeks gestation of pregnancy: Secondary | ICD-10-CM

## 2021-10-18 DIAGNOSIS — Z98891 History of uterine scar from previous surgery: Secondary | ICD-10-CM

## 2021-10-18 DIAGNOSIS — Z20822 Contact with and (suspected) exposure to covid-19: Secondary | ICD-10-CM | POA: Diagnosis present

## 2021-10-18 DIAGNOSIS — O99354 Diseases of the nervous system complicating childbirth: Secondary | ICD-10-CM | POA: Diagnosis present

## 2021-10-18 DIAGNOSIS — O30009 Twin pregnancy, unspecified number of placenta and unspecified number of amniotic sacs, unspecified trimester: Secondary | ICD-10-CM

## 2021-10-18 DIAGNOSIS — O30043 Twin pregnancy, dichorionic/diamniotic, third trimester: Secondary | ICD-10-CM | POA: Diagnosis not present

## 2021-10-18 DIAGNOSIS — O3430 Maternal care for cervical incompetence, unspecified trimester: Secondary | ICD-10-CM | POA: Diagnosis present

## 2021-10-18 LAB — GROUP B STREP BY PCR: Group B strep by PCR: NEGATIVE

## 2021-10-18 LAB — CBC
HCT: 33.2 % — ABNORMAL LOW (ref 36.0–46.0)
Hemoglobin: 10.8 g/dL — ABNORMAL LOW (ref 12.0–15.0)
MCH: 26.2 pg (ref 26.0–34.0)
MCHC: 32.5 g/dL (ref 30.0–36.0)
MCV: 80.4 fL (ref 80.0–100.0)
Platelets: 366 10*3/uL (ref 150–400)
RBC: 4.13 MIL/uL (ref 3.87–5.11)
RDW: 13.8 % (ref 11.5–15.5)
WBC: 7.6 10*3/uL (ref 4.0–10.5)
nRBC: 0.3 % — ABNORMAL HIGH (ref 0.0–0.2)

## 2021-10-18 LAB — RESP PANEL BY RT-PCR (FLU A&B, COVID) ARPGX2
Influenza A by PCR: NEGATIVE
Influenza B by PCR: NEGATIVE
SARS Coronavirus 2 by RT PCR: NEGATIVE

## 2021-10-18 LAB — TYPE AND SCREEN
ABO/RH(D): O POS
Antibody Screen: NEGATIVE

## 2021-10-18 MED ORDER — BETAMETHASONE SOD PHOS & ACET 6 (3-3) MG/ML IJ SUSP
12.0000 mg | INTRAMUSCULAR | Status: AC
  Administered 2021-10-18 – 2021-10-19 (×2): 12 mg via INTRAMUSCULAR
  Filled 2021-10-18: qty 5

## 2021-10-18 MED ORDER — MAGNESIUM SULFATE BOLUS VIA INFUSION
6.0000 g | Freq: Once | INTRAVENOUS | Status: DC
  Filled 2021-10-18: qty 1000

## 2021-10-18 MED ORDER — CALCIUM CARBONATE ANTACID 500 MG PO CHEW
2.0000 | CHEWABLE_TABLET | ORAL | Status: DC | PRN
  Administered 2021-10-18: 400 mg via ORAL
  Filled 2021-10-18: qty 2

## 2021-10-18 MED ORDER — PRENATAL MULTIVITAMIN CH
1.0000 | ORAL_TABLET | Freq: Every day | ORAL | Status: DC
  Administered 2021-10-20 – 2021-10-21 (×2): 1 via ORAL
  Filled 2021-10-18 (×2): qty 1

## 2021-10-18 MED ORDER — FAMOTIDINE IN NACL 20-0.9 MG/50ML-% IV SOLN
20.0000 mg | Freq: Two times a day (BID) | INTRAVENOUS | Status: DC | PRN
  Administered 2021-10-18: 20 mg via INTRAVENOUS
  Filled 2021-10-18: qty 50

## 2021-10-18 MED ORDER — DOCUSATE SODIUM 100 MG PO CAPS
100.0000 mg | ORAL_CAPSULE | Freq: Every day | ORAL | Status: DC
  Administered 2021-10-19 – 2021-10-22 (×4): 100 mg via ORAL
  Filled 2021-10-18 (×5): qty 1

## 2021-10-18 MED ORDER — ACETAMINOPHEN 325 MG PO TABS: 650.0000 mg | ORAL_TABLET | ORAL | Status: DC | PRN

## 2021-10-18 MED ORDER — SODIUM CHLORIDE 0.9% FLUSH: 3.0000 mL | INTRAVENOUS | Status: DC | PRN

## 2021-10-18 MED ORDER — SODIUM CHLORIDE 0.9 % IV SOLN: 250.0000 mL | INTRAVENOUS | Status: DC | PRN

## 2021-10-18 MED ORDER — LACTATED RINGERS IV SOLN: INTRAVENOUS | Status: DC

## 2021-10-18 MED ORDER — SODIUM CHLORIDE 0.9% FLUSH: 3.0000 mL | Freq: Two times a day (BID) | INTRAVENOUS | Status: DC

## 2021-10-18 MED ORDER — MAGNESIUM SULFATE 40 GM/1000ML IV SOLN: 1.0000 g/h | INTRAVENOUS | Status: DC

## 2021-10-18 NOTE — H&P (Addendum)
Makayla [name] is a 36 y.o. female G1P0 [redacted]w[redacted]d with DCDA twins presenting for direct admission to rule out preterm labor.   Pregnancy has been complicated by cervical shortening (1.7cm at anatomy scan, follow up scan on 1/5 with MFM shows 1.6cm cervix with shortening to 1mm with transfundal pressure)) which was treated with vaginal progesterone and subsequently a rescue cerclage at 22 weeks.   Today, she presented to Dr. Orville Govern office with complaint of waves of pelvic/rectal pressure for the past few days. States maximum frequency was 3 times in 1 hour. States the pressure is "7/10" intensity. She had some whitish discharge that is typical for her given vaginal progesterone use, no watery discharge or vaginal bleeding. Dr. Reina Fuse performed a speculum exam, "funnel cervical membranes with external os 3cm dilated, cerclage palpates intact, no VB on exam."  Babies have been moving normally.   Pregnancy c/b: Cervical shortening as above DCDA twin pregnancy, s/p IVF with donor eggs Sjogren's syndrome (+Anti-RO), declined fetal echo due to cost History of scoliosis surgery with rods: spinal was not able to be placed at time of cerclage  OB History     Gravida  1   Para      Term      Preterm      AB      Living  0      SAB      IAB      Ectopic      Multiple      Live Births             Past Medical History:  Diagnosis Date   Anemia    Depression    IBS (irritable bowel syndrome)    Ovarian failure    Scoliosis    Sickle cell trait in mother affecting pregnancy (HCC)    Sjogren's disease (HCC)    Past Surgical History:  Procedure Laterality Date   CERVICAL CERCLAGE N/A 09/16/2021   Procedure: CERCLAGE CERVICAL;  Surgeon: Edwinna Areola, DO;  Location: [facility] LD ORS;  Service: Gynecology;  Laterality: N/A;   SPINE SURGERY     WISDOM TOOTH EXTRACTION     Family History: family history includes Bell's palsy in her father; Diabetes in her mother; Hypertension  in her father and mother. Social History:  reports that she has never smoked. She has never used smokeless tobacco. She reports that she does not currently use alcohol. She reports that she does not use drugs.     Maternal Diabetes: Not yet tested Genetic Screening: No preimplantation genetic testing done per chart Maternal Ultrasounds/Referrals: Other: cervical shortening Fetal Ultrasounds or other Referrals:  Referred to Materal Fetal Medicine  Maternal Substance Abuse:  No Significant Maternal Medications:  None Significant Maternal Lab Results:  None Other Comments:  None  Review of Systems Per HPI Exam Physical Exam    Last menstrual period 04/25/2021. Gen: NAD, resting comfortably CVS: normal pulses Lungs: nonlabored respirations Abd: Gravid abdomen, soft Ext: no calf edema or tenderness  Fetal testing:  Baby A: 150bpm, mod variability, + accels, no decels Baby B: 140bpm, mod variability, + accels, no decels Toco: quiet Prenatal labs: ABO, Rh:  --/--/O POS (01/08 0370) Antibody: NEG (01/08 0636) Rubella:  imm RPR:   neg HBsAg:   neg HIV:   neg GBS:   pending  Assessment/Plan: 35Y G1P0 @ [redacted]w[redacted]d with DCDA twin pregnancy, cervical shortening s/p cerclage, now with 3cm dilation and visible membranes on speculum exam in office, reporting irregular pelvic  pressure Admit to [facility] specialty care for rule out preterm labor MFM consulted by Dr. Reina Fuse: recommend transabdominal growth [place] and betamethasone, bedrest +/- trendelenburg.  If contractions noted on continuous toco, would start magnesium for neuroprotection and consider cerclage removal. Recommend spec exam late morning tomorrow to assess for cervical change if not indicated prior.  MFM will formally consult in AM.  NICU consult requested, accepted by Dr. Burnadette Pop History of scoliosis surgery: surgery records printed and provided to Dr. Tacy Dura (anesthesiologist on call) for review. Per conversation with Dr. Malen Gauze  previously, could consider spinal placement L5-S1. Patient aware general anesthesia may be required if cesarean. I spoke with patient about possible outcomes. Twins are currently vtx/vtx - if she progresses into labor, she needs to decide if she wants to trial vaginal delivery (with possibility of no epidural) or primary cesarean (with possibility of spinal or general anesthesia). She is undecided at this point and wants to think about it. She is concerned about possibility of a vaginal delivery for one and cesarean for the second. She will let me know what she decides.  Saline lock IV, NPO for now CBC, T&S, rapid GBS ordered.  Growth scan pending Continuous fetal tracing and toco for now. If contractions become evident, will start magnesium, antibiotics, and consider cerclage removal. Betamethasone course started (2/9 at 1651)  Charlett Nose 10/18/2021, 5:35 PM

## 2021-10-18 NOTE — Consult Note (Signed)
[facility]  --  [place]    PRENATAL CONSULT NOTE   I was asked by Dr. Casper Harrison team to consult on this patient for probable preterm delivery.  I had the pleasure of meeting with Makayla [name] today.  She is married but her husband was not available at the time of the consult.         Makayla [name] is [name] G1 with di/di Makayla conceived via IFV and now at 27+1 gestation.  Pregnancy has been complicated by cervical incompetence s/p cerclage placement, AMA, and maternal Sjogren's syndrome with +Anti-RO.  She presented today with pelvic pressure/pain and was found to be 3cm dilated.  She is being admitted for further observation and concern for likely impending delivery.  The cerclage is still currently in place.  She has been given 1 dose of betamethasone already and will undergo growth scan tonight.  Makayla [name] is [name] girl, to be named Makayla [name], and Makayla [name] is [name] boy, to be named Makayla [name].    Counseling We discussed the expected hospital course and expected length of stay following delivery of twins at 27 weeks.  We discussed the significant problems associated with prematurity, including respiratory distress syndrome/CLD, IVH/PVL, ROP, NEC, and infection risk, but that the risk of these complications decreased with every week she remains pregnant.  I explained that the infants would require immediate care after delivery but that we would attempt to show them to her prior to transferring to the NICU.  We discussed the importance of providing breast milk.  Mrs. Haire plans to attempt pumping but is also amenable to donor breast milk.   Thank you for involving [place] in the care of this patient. [name] member of our team will be available should the family have additional questions.  Time for consultation was approximately 45 minutes, of which more than half was face-to-face.    _____________________ Electronically Signed By: Karie Schwalbe, MD, MS Neonatologist

## 2021-10-18 NOTE — Progress Notes (Addendum)
OB Progress Note  S:  Patient reports she is feeling the same. Most of the time she is resting without any pain. Since being on the fetal monitor, she is starting to notice the vaginal pressure is associated with baby A's movements.  While I was in the room speaking to her, we could hear baby A moving on the heart rate monitor as she endorsed an increase in pressure. She is not feeling abdominal tightening at this time and toco continues to be quiet.   Ultrasound was done and report is pending. Per the patient, baby A's head is very low in her pelvis and was difficult to scan.  Prelim EFW baby A: 2lb (896g, 9%), baby B 2lb3oz (997g, 28%)  Patient reports feeling emotional after NICU consultation. States she would stay in the hospital for 12 more weeks if it meant the babies would reach term.    O: Today's Vitals   10/18/21 1742 10/18/21 1952 10/18/21 2023  BP: 135/79  128/64  Pulse: 89  92  Resp: 16  20  Temp: 98.7 F (37.1 C)  98.6 F (37 C)  TempSrc: Oral  Oral  SpO2: 98%  98%  PainSc:  0-No pain    There is no height or weight on file to calculate BMI.   FHR baby A: 150bpm, mod variability, + accels, no decels Baby B: 145bpm, mod variability, + accels, no decels Toco: quiet   Plan as before. Patient c/o GERD in Trendelenberg, will put bed flat, pepcid IV PRN. Given lack of evident contractions, will allow a light meal (soup, popsicle) and then return to NPO after midnight in case of change of status tomorrow.  Follow up MFM consult in AM. Patient aware to alert staff in change in symptoms.   Alinda Deem, MD 10/18/21 9:58 PM

## 2021-10-19 DIAGNOSIS — O3432 Maternal care for cervical incompetence, second trimester: Secondary | ICD-10-CM | POA: Diagnosis not present

## 2021-10-19 DIAGNOSIS — O30042 Twin pregnancy, dichorionic/diamniotic, second trimester: Secondary | ICD-10-CM | POA: Diagnosis not present

## 2021-10-19 DIAGNOSIS — Z3A27 27 weeks gestation of pregnancy: Secondary | ICD-10-CM

## 2021-10-19 MED ORDER — FAMOTIDINE 20 MG PO TABS
20.0000 mg | ORAL_TABLET | Freq: Two times a day (BID) | ORAL | Status: DC | PRN
  Administered 2021-10-20 – 2021-10-21 (×2): 20 mg via ORAL
  Filled 2021-10-19 (×2): qty 1

## 2021-10-19 MED ORDER — DOXYLAMINE SUCCINATE (SLEEP) 25 MG PO TABS
25.0000 mg | ORAL_TABLET | Freq: Every evening | ORAL | Status: DC | PRN
  Administered 2021-10-19: 25 mg via ORAL
  Filled 2021-10-19: qty 1

## 2021-10-19 MED ORDER — ASPIRIN EC 81 MG PO TBEC
81.0000 mg | DELAYED_RELEASE_TABLET | Freq: Every day | ORAL | Status: DC
  Administered 2021-10-19 – 2021-10-22 (×4): 81 mg via ORAL
  Filled 2021-10-19 (×4): qty 1

## 2021-10-19 NOTE — Progress Notes (Signed)
Attempted to round on patient, but patient sleeping.  Will return when awake to review MFM plan of care.

## 2021-10-19 NOTE — Progress Notes (Signed)
Patient seen and examined.  Denies contractions, vaginal bleeding.  State pressure and discomfort much less today.  She was seen by Dr. Parke Poisson this AM (detailed consult note in chart).  He rechecked her cervix today and was 3-4/90/ballotable.  He reported no tension on the stitch  BP 128/88 (BP Location: Left Arm)    Pulse 96    Temp 98.2 F (36.8 C) (Oral)    Resp 17    Ht 5\' 10"  (1.778 m)    Wt 112.2 kg Comment: most recent   LMP 04/25/2021    SpO2 97%    BMI 35.49 kg/m   NAD Abdomen: soft, non-tender SVE deferred Ext: no edema, no SCDs  EFM: A 150s, moderate variability, category 1; B 150s, moderate variability category 1 Toco: quiet  04/27/2021 2/9: Twin A: 896 g, 2 pounds (9th percentile for her gestational age).  The EFW for twin A may have been inaccurate as the fetal head was very low in her pelvis.   Twin B: 997 g, 2 pounds 3 ounces (28th percentile for her gestational age).     Both fetuses are in the vertex/vertex presentations. Discordance 10%   A/P:  G1 @ [redacted]w[redacted]d with dichorionic diamniotic twin pregnancy via IVF, complicated by cervical shortening and rescue cerclage at 22 weeks  Threatened preterm labor / prematurity.  Recommendations per MFM : -Inpatient management on modified bedrest until she reaches a more optimal gestational age (32 weeks or greater) -Complete course of antenatal corticosteroids (dose #2 today 2/10 at 1700) -Rescue course of steroids as indicated -Magnesium for fetal neuroprotection should she be at risk for delivery before 32 weeks -Nifedipine for tocolysis as needed -Removal of cerclage stitch should she have frequent contractions or vaginal bleeding -discontinue vaginal prometrium   He also recommended BID NST. She has received a NICU consult  2.  History of scoliosis surgery: surgery records printed and provided to Dr. 4/10 yesterday (anesthesiologist on call) for review. Per conversation with Dr. Tacy Dura previously, could consider spinal placement  L5-S1. Patient aware general anesthesia may be required if cesarean. Twins are currently vtx/vtx - if she progresses into labor, she needs to decide if she wants to trial vaginal delivery (with possibility of no epidural) or primary cesarean (with possibility of spinal or general anesthesia). She is undecided at this point and wants to think about it.  We discussed the chance of baby B converting to a non-vertex position is approximately 10% as she is concerned about possibility of a vaginal delivery for one and cesarean for the second.  3.  Will order SCDs for prophylaxis 4.

## 2021-10-19 NOTE — Consult Note (Signed)
MFM Note  Makayla [name] is a 36 year old gravida 1 para 0 currently at 27 weeks and 2 days. She was seen in consultation today at the request of Dr. Brien Mates due to cervical dilatation/preterm labor in a dichorionic, diamniotic twin gestation.  The patient had a cervical cerclage placed due to cervical shortening that was noted during her fetal anatomy scan.  She was also treated with daily vaginal progesterone.  She was seen in the office yesterday due to complaints of increasing pelvic pressure especially during fetal movements.  A speculum exam performed yesterday showed that her cervix was 3 cm dilated with bulging membranes.  Due to this finding, she was admitted to the hospital to be placed on modified bedrest and to receive a complete course of antenatal corticosteroids.  Currently, the patient denies feeling any contractions.  No contractions are noted on the toco.  She denies any vaginal bleeding and she is feeling less pelvic pressure.  I performed a digital exam this morning and found that her cervix is 3 to 4 cm dilated and 90% effaced.  There was no tension on the cerclage stitch.  The fetal head was ballotable.  She had an ultrasound performed last night showing the following EFW:  Twin A: 896 g, 2 pounds (9th percentile for her gestational age).  The EFW for twin A may have been inaccurate as the fetal head was very low in her pelvis.  Twin B: 997 g, 2 pounds 3 ounces (28th percentile for her gestational age).    Both fetuses are in the vertex/vertex presentations.  This is an IVF pregnancy that was conceived using donor eggs.    The patient's pregnancy has also been complicated by a history of Sjogren syndrome.  She has screened positive for the SSA antibodies.  The increased risk of a preterm delivery due to her advanced cervical dilatation was discussed with the patient and her husband.  Due to her advanced cervical dilatation in a twin gestation, I would recommend  continued inpatient management on modified bedrest in an attempt to help her reach a more optimal gestational age for delivery.  The patient and her husband were advised that the goal for her is to reach 32 weeks or greater.  She should receive a complete course of antenatal corticosteroids.  A rescue course of steroids (2 additional doses of betamethasone 24 hours apart) should be given should she be at risk for delivery before 34 weeks and it has been 7 days or greater since she received the initial course.  Magnesium sulfate for fetal neuroprotection should be given (for at least 3 to 4 hours) should she be at risk of delivery (complain of increased contractions) prior to 32 weeks.  Nifedipine may be considered for tocolysis should she complain of contractions.  As she is not experiencing any vaginal bleeding and there is no tension on the cerclage stitch, I would keep the cerclage stitch in place for now.  Her cerclage should be removed should she have frequent contractions or develops vaginal bleeding.  As her cervix is already 3 to 4 cm dilated, I would discontinue the daily vaginal progesterone as the progesterone will probably have minimal benefits at this time.  She should be monitored with twice daily NSTs.  The patient and her husband are comfortable with the plan for continued inpatient management until she reaches a more optimal gestational age.    They stated that all of their questions have been answered.  Recommendations:  Inpatient management on  modified bedrest until she reaches a more optimal gestational age (31 weeks or greater) Complete course of antenatal corticosteroids Rescue course of steroids as indicated Magnesium for fetal neuroprotection should she be at risk for delivery before 32 weeks Nifedipine for tocolysis as needed Removal of cerclage stitch should she have frequent contractions or vaginal bleeding

## 2021-10-20 LAB — CULTURE, OB URINE: Culture: NO GROWTH

## 2021-10-20 NOTE — Progress Notes (Signed)
HD #3, [redacted]W[redacted]D, di/di twins, incompetent cervix Feeling ok, nothing new.  +FM, no VB, no ctx, no LOF Afeb, VSS FHT appropriate for EGA  Continue modified bedrest, monitor closely

## 2021-10-21 NOTE — Progress Notes (Signed)
Pt refuses evening NST due to abd discomfort from lack of BM today, per pt. Dr. Jackelyn Knife updated and dulcolax suppository offered. Pt declines.

## 2021-10-21 NOTE — Progress Notes (Signed)
HD #4, [redacted]W[redacted]D, di/di twins, incompetent cervix Doing ok, some pelvic pressure off and on, +FM, no ctx, no LOF or VB Afeb, VSS FHT-appropriate for EGA Fundus NT  Continue expectant management

## 2021-10-22 LAB — TYPE AND SCREEN
ABO/RH(D): O POS
Antibody Screen: NEGATIVE

## 2021-10-22 LAB — CBC WITH DIFFERENTIAL/PLATELET
Abs Immature Granulocytes: 0.06 10*3/uL (ref 0.00–0.07)
Basophils Absolute: 0 10*3/uL (ref 0.0–0.1)
Basophils Relative: 0 %
Eosinophils Absolute: 0.1 10*3/uL (ref 0.0–0.5)
Eosinophils Relative: 1 %
HCT: 32.6 % — ABNORMAL LOW (ref 36.0–46.0)
Hemoglobin: 11.1 g/dL — ABNORMAL LOW (ref 12.0–15.0)
Immature Granulocytes: 1 %
Lymphocytes Relative: 13 %
Lymphs Abs: 1.2 10*3/uL (ref 0.7–4.0)
MCH: 27.3 pg (ref 26.0–34.0)
MCHC: 34 g/dL (ref 30.0–36.0)
MCV: 80.1 fL (ref 80.0–100.0)
Monocytes Absolute: 0.7 10*3/uL (ref 0.1–1.0)
Monocytes Relative: 8 %
Neutro Abs: 7 10*3/uL (ref 1.7–7.7)
Neutrophils Relative %: 77 %
Platelets: 337 10*3/uL (ref 150–400)
RBC: 4.07 MIL/uL (ref 3.87–5.11)
RDW: 14.3 % (ref 11.5–15.5)
WBC: 9 10*3/uL (ref 4.0–10.5)
nRBC: 1.1 % — ABNORMAL HIGH (ref 0.0–0.2)

## 2021-10-22 MED ORDER — DEXAMETHASONE SODIUM PHOSPHATE 10 MG/ML IJ SOLN
INTRAMUSCULAR | Status: AC
  Filled 2021-10-22: qty 1

## 2021-10-22 MED ORDER — MAGNESIUM SULFATE BOLUS VIA INFUSION
4.0000 g | Freq: Once | INTRAVENOUS | Status: AC
  Administered 2021-10-22: 4 g via INTRAVENOUS
  Filled 2021-10-22: qty 1000

## 2021-10-22 MED ORDER — MAGNESIUM SULFATE BOLUS VIA INFUSION
4.0000 g | Freq: Once | INTRAVENOUS | Status: DC
  Filled 2021-10-22: qty 1000

## 2021-10-22 MED ORDER — MAGNESIUM SULFATE 40 GM/1000ML IV SOLN
INTRAVENOUS | Status: AC
  Filled 2021-10-22: qty 1000

## 2021-10-22 MED ORDER — MAGNESIUM SULFATE 40 GM/1000ML IV SOLN
2.0000 g/h | INTRAVENOUS | Status: DC
  Administered 2021-10-22: 2 g/h via INTRAVENOUS

## 2021-10-22 MED ORDER — CEFAZOLIN SODIUM-DEXTROSE 2-4 GM/100ML-% IV SOLN
2.0000 g | INTRAVENOUS | Status: DC
  Filled 2021-10-22 (×2): qty 100

## 2021-10-22 MED ORDER — ONDANSETRON HCL 4 MG/2ML IJ SOLN
INTRAMUSCULAR | Status: AC
  Filled 2021-10-22: qty 2

## 2021-10-22 MED ORDER — PROPOFOL 10 MG/ML IV BOLUS
INTRAVENOUS | Status: AC
  Filled 2021-10-22: qty 20

## 2021-10-22 MED ORDER — SOD CITRATE-CITRIC ACID 500-334 MG/5ML PO SOLN
30.0000 mL | ORAL | Status: AC
  Administered 2021-10-23: 30 mL via ORAL
  Filled 2021-10-22: qty 30

## 2021-10-22 MED ORDER — LACTATED RINGERS IV SOLN: INTRAVENOUS | Status: DC

## 2021-10-22 NOTE — Progress Notes (Signed)
Presented to patient bedside for re-evaluation given loss of cerclage earlier today. Patient still endorsing FM and denies VB and LOF, but notes vaginal pain and pressure is "different" than before and is more painful ranking 7/10 when it occurs. Discomfort comes and goes but patient has not been timing  BP (!) 146/90 (BP Location: Right Arm)    Pulse 89    Temp 98.6 F (37 C) (Oral)    Resp 19    Ht 5\' 10"  (1.778 m)    Wt 112.2 kg Comment: most recent   LMP 04/25/2021    SpO2 95%    BMI 35.49 kg/m  Repeat SVE is 5/90/-1 with bulging bag noted with contractions. BSUS confirms vtx/vtx still  Reviewed with patient that preterm labor may be advancing and MgSO4 for CP ppx is indicated, amenable to starting med.  Spoke with anesthesia for L&D tonight, Dr 04/27/2021, who is aware of patient's scoliosis surgical history and extent of rod/hook placement. Is coming to speak with patient but feels that the level of comfort which patient desires to attempt vaginal delivery will not be feasible given her surgical history. It would be in patient's best interest, therefore, to have a controlled general anesthesia delivery. Patient is aware of this possibility and is amenable. Understands that NICU will be present for delivery and husband will not be able to be present given general anesthesia but that we will keep him updated. T&S active, stat CBC ordered  Last PO intake 0700, plan for scheduled procedure at 0200, pt NPO at this time. OR staff notified as well. Should dilation progress more rapidly, will more up accordingly.   R/B/A of cesarean section discussed with patient. Alternative would be vaginal delivery which would mean shorter postpartum stay and decreased risk of bleeding. Risks of section include infection of the uterus, pelvic organs, or skin, inadvertent injury to internal organs, such as bowel or bladder. If there is major injury, extensive surgery may be required. If injury is minor, it may be treated with  relative ease. Discussed possibility of excessive blood loss and transfusion. If bleeding cannot be controlled using medical or minor surgical methods, a cesarean hysterectomy may be performed which would mean no future fertility. Patient accepts the possibility of blood transfusion, if necessary. Patient understands and agrees to move forward with section.

## 2021-10-22 NOTE — Anesthesia Preprocedure Evaluation (Signed)
Anesthesia Evaluation  Patient identified by MRN, date of birth, ID band Patient awake    Reviewed: Allergy & Precautions, NPO status , Patient's Chart, lab work & pertinent test results  History of Anesthesia Complications (+) history of anesthetic complications (failed spinal placement for cerclage)  Airway Mallampati: III  TM Distance: >3 FB Neck ROM: Full    Dental no notable dental hx.    Pulmonary neg pulmonary ROS,    Pulmonary exam normal breath sounds clear to auscultation       Cardiovascular negative cardio ROS Normal cardiovascular exam Rhythm:Regular Rate:Normal     Neuro/Psych PSYCHIATRIC DISORDERS Depression negative neurological ROS     GI/Hepatic negative GI ROS, Neg liver ROS,   Endo/Other  negative endocrine ROS  Renal/GU negative Renal ROS  negative genitourinary   Musculoskeletal Scoliosis s/p harrington rods   Abdominal   Peds negative pediatric ROS (+)  Hematology  (+) Blood dyscrasia, Sickle cell trait ,   Anesthesia Other Findings Sjogren's disease  Reproductive/Obstetrics (+) Pregnancy                             Anesthesia Physical Anesthesia Plan  ASA: 3 and emergent  Anesthesia Plan: General   Post-op Pain Management: Toradol IV (intra-op)* and Ofirmev IV (intra-op)*   Induction: Rapid sequence and Intravenous  PONV Risk Score and Plan: 3 and Treatment may vary due to age or medical condition, Scopolamine patch - Pre-op, Ondansetron and Dexamethasone  Airway Management Planned: Oral ETT  Additional Equipment: None  Intra-op Plan:   Post-operative Plan: Extubation in OR  Informed Consent: I have reviewed the patients History and Physical, chart, labs and discussed the procedure including the risks, benefits and alternatives for the proposed anesthesia with the patient or authorized representative who has indicated his/her understanding and  acceptance.     Dental advisory given  Plan Discussed with: CRNA, Anesthesiologist and Surgeon  Anesthesia Plan Comments: (Preterm twins, cerclage fell out and patient dilated from 3-5cm. Patient ate at ~7pm. H/o harrington rod placement. Failed spinal attempts for cerclage placement, required GETA. Had a thorough discussion with patient, she is not interested in another spinal attempt for her C-section. She is choosing GETA. I reviewed the risks including aspiration given her meal. She expressed understanding and agrees to proceed. Tanna Furry, MD  )        Anesthesia Quick Evaluation

## 2021-10-22 NOTE — Progress Notes (Signed)
Called to patient bedside by RN stating patient's cerclage was "out." Presented to bedside, patient comfortably on monitor, both babies tracing well. Patient states she had gotten up to use the bathroom for BM, did not strain. When she wiped, noticed very thick string not from her clothing and called staff. Suture on toilet paper, consistent with [place] suture used for procedure; evidence of multiple knots present though appears unravelled. BSUS confirms vtx/vtx presentation still. Very gentle SVE still shows 3-4/90 for exam however station is now -2, aka no longer ballotable. Bag still palpable, no evidence of VB. Patient denies cramping, either persistent or recurring, does note vaginal pressure during check. Continue monitoring 1hr at this time.   Given no change in dilation, unlikely that patient is truly laboring at this time. Likely pressure from fetal occiput with known short cervix may have slowly caused release of cerclage, no way of knowing at this time. Should patient endorse discrete cramping/, Nifedipine trial but if dilation increases, plan for MgSo4 and proceed with delivery. Close eye on status  BP 127/74 (BP Location: Right Arm)    Pulse (!) 115    Temp 98.3 F (36.8 C) (Oral)    Resp 20    Ht 5\' 10"  (1.778 m)    Wt 112.2 kg Comment: most recent   LMP 04/25/2021    SpO2 95%    BMI 35.49 kg/m

## 2021-10-22 NOTE — Progress Notes (Signed)
Bed pan offered to patient per Dr. Orville Govern request. Patient declines.

## 2021-10-22 NOTE — Progress Notes (Signed)
AP PROGRESS NOTE S: Patient endorses +FM x2, denies VB, LOF. Eventually had BM last night, lower abdominal tightness/fullness significantly improved. Reviewed bowel regimen meds available to patient. Also reviewed that, given high risk status, even though patient herself may not have been concerned about true preterm labor, staff may perform exam/EFM in order to ascertain +/- uterine irritability. Patient understands. Also reviewed current EGA and need for glucola this week. Finally, spoke regarding birth plan for patient. Patient states her doula and she worked on this last Friday. Would like to attempt epidural for vaginal delivery if able to get comfortable. If there is any difficulty placing, would be amenable to PLTCS. Once again, expresses concerns regarding labor and SVD for baby A and possible section for baby B. Reviewed presentation may change in approx 10% of situations requiring section, unable to predict accurately esp in preterm nullip pregnancy   O: BP 137/84 (BP Location: Right Arm)    Pulse 85    Temp 98.1 F (36.7 C) (Oral)    Resp 15    Ht 5\' 10"  (1.778 m)    Wt 112.2 kg Comment: most recent   LMP 04/25/2021    SpO2 96%    BMI 35.49 kg/m  Gen: Supine, NAD, pleasant CV: RRR, CTAB Abd: soft, NTTP GU deferred  MSK: neg calf TTP, SCDs off  GS 2/9: 4/9 2/9: Twin A: 896 g, 2 pounds (9th percentile for her gestational age).  The EFW for twin A may have been inaccurate as the fetal head was very low in her pelvis.   Twin B: 997 g, 2 pounds 3 ounces (28th percentile for her gestational age).    A/P: This is a G1P0 @ 27 5/7 with IVF di-di-TIUP admitted with cervical shortening and rescue cerclage and preterm cervical dilation. PMHx s/f scoliosis surgery, Sjogren's w/ +Anti-RO  1) Preterm cervical dilation in setting for cerclage -Stable CE since admission, 3-4/90/ballotable (2/13) with intact suture -S/p BMTZ course 2/9-10 -PRN Nifedipine for tocolysis -MgSO4 and cerclage removal should  labor progress prior to 32wks -S/p both MFM and NICU consults  2) di-di-TIUP @ 27 5/7 -NST BID -1hr GTT ordered for 2/15 @ 28 0/7 for routine antenatal screening -Last GS on 2/9, plan for repeat in 2wks  3) S/p Scoliosis rod placement -Records available in chart, anesthesia team aware and has spoken with patient -At this time, patient would like to attempt vaginal delivery if epidural is able to be placed and vtx/vtx continues. If pain relief inadequate, difficulty placing or presentation changes, amenable to PLTCS.

## 2021-10-22 NOTE — Progress Notes (Signed)
Pt called out stating her cerclage came out while up to void. Several inches of suture visible on toilet paper. No VBL, LOF, or uterine contractions. Dr. Reina Fuse to bedside.

## 2021-10-23 ENCOUNTER — Inpatient Hospital Stay (HOSPITAL_COMMUNITY): Payer: 59 | Admitting: Certified Registered Nurse Anesthetist

## 2021-10-23 ENCOUNTER — Encounter (HOSPITAL_COMMUNITY)
Admission: AD | Disposition: A | Discharge status: Home / Self Care | Payer: Self-pay | Source: Home / Self Care | Attending: Obstetrics and Gynecology

## 2021-10-23 DIAGNOSIS — Z3A27 27 weeks gestation of pregnancy: Secondary | ICD-10-CM

## 2021-10-23 DIAGNOSIS — O30043 Twin pregnancy, dichorionic/diamniotic, third trimester: Secondary | ICD-10-CM

## 2021-10-23 LAB — CBC
HCT: 31.4 % — ABNORMAL LOW (ref 36.0–46.0)
Hemoglobin: 10.4 g/dL — ABNORMAL LOW (ref 12.0–15.0)
MCH: 26.6 pg (ref 26.0–34.0)
MCHC: 33.1 g/dL (ref 30.0–36.0)
MCV: 80.3 fL (ref 80.0–100.0)
Platelets: 340 10*3/uL (ref 150–400)
RBC: 3.91 MIL/uL (ref 3.87–5.11)
RDW: 14.3 % (ref 11.5–15.5)
WBC: 13.1 10*3/uL — ABNORMAL HIGH (ref 4.0–10.5)
nRBC: 0.2 % (ref 0.0–0.2)

## 2021-10-23 SURGERY — Surgical Case
Anesthesia: General

## 2021-10-23 MED ORDER — DOCUSATE SODIUM 100 MG PO CAPS
200.0000 mg | ORAL_CAPSULE | Freq: Two times a day (BID) | ORAL | Status: DC
  Administered 2021-10-23 – 2021-10-24 (×3): 200 mg via ORAL
  Filled 2021-10-23 (×5): qty 2

## 2021-10-23 MED ORDER — TETANUS-DIPHTH-ACELL PERTUSSIS 5-2.5-18.5 LF-MCG/0.5 IM SUSY
0.5000 mL | PREFILLED_SYRINGE | Freq: Once | INTRAMUSCULAR | Status: AC
  Administered 2021-10-25: 0.5 mL via INTRAMUSCULAR
  Filled 2021-10-23: qty 0.5

## 2021-10-23 MED ORDER — ACETAMINOPHEN 10 MG/ML IV SOLN
INTRAVENOUS | Status: AC
  Filled 2021-10-23: qty 100

## 2021-10-23 MED ORDER — KETOROLAC TROMETHAMINE 30 MG/ML IJ SOLN: 30.0000 mg | Freq: Four times a day (QID) | INTRAMUSCULAR | Status: DC | PRN

## 2021-10-23 MED ORDER — MIDAZOLAM HCL 2 MG/2ML IJ SOLN
INTRAMUSCULAR | Status: DC | PRN
  Administered 2021-10-23: 2 mg via INTRAVENOUS

## 2021-10-23 MED ORDER — MENTHOL 3 MG MT LOZG
1.0000 | LOZENGE | OROMUCOSAL | Status: DC | PRN
  Administered 2021-10-23: 3 mg via ORAL
  Filled 2021-10-23: qty 9

## 2021-10-23 MED ORDER — DIBUCAINE (PERIANAL) 1 % EX OINT: 1.0000 "application " | TOPICAL_OINTMENT | CUTANEOUS | Status: DC | PRN

## 2021-10-23 MED ORDER — METHYLERGONOVINE MALEATE 0.2 MG/ML IJ SOLN
INTRAMUSCULAR | Status: AC
  Filled 2021-10-23: qty 1

## 2021-10-23 MED ORDER — WITCH HAZEL-GLYCERIN EX PADS: 1.0000 "application " | MEDICATED_PAD | CUTANEOUS | Status: DC | PRN

## 2021-10-23 MED ORDER — ONDANSETRON HCL 4 MG/2ML IJ SOLN: 4.0000 mg | Freq: Three times a day (TID) | INTRAMUSCULAR | Status: DC | PRN

## 2021-10-23 MED ORDER — FENTANYL CITRATE (PF) 100 MCG/2ML IJ SOLN
INTRAMUSCULAR | Status: DC | PRN
  Administered 2021-10-23: 50 ug via INTRAVENOUS
  Administered 2021-10-23: 150 ug via INTRAVENOUS

## 2021-10-23 MED ORDER — ACETAMINOPHEN 500 MG PO TABS: 1000.0000 mg | ORAL_TABLET | Freq: Four times a day (QID) | ORAL | Status: DC

## 2021-10-23 MED ORDER — OXYCODONE HCL 5 MG/5ML PO SOLN: 5.0000 mg | Freq: Once | ORAL | Status: DC | PRN

## 2021-10-23 MED ORDER — CEFAZOLIN SODIUM-DEXTROSE 2-3 GM-%(50ML) IV SOLR
INTRAVENOUS | Status: DC | PRN
  Administered 2021-10-23: 2 g via INTRAVENOUS

## 2021-10-23 MED ORDER — AMISULPRIDE (ANTIEMETIC) 5 MG/2ML IV SOLN
10.0000 mg | Freq: Once | INTRAVENOUS | Status: DC | PRN
  Filled 2021-10-23: qty 4

## 2021-10-23 MED ORDER — ACETAMINOPHEN 10 MG/ML IV SOLN
INTRAVENOUS | Status: DC | PRN
  Administered 2021-10-23: 1000 mg via INTRAVENOUS

## 2021-10-23 MED ORDER — SUCCINYLCHOLINE CHLORIDE 200 MG/10ML IV SOSY
PREFILLED_SYRINGE | INTRAVENOUS | Status: DC | PRN
Start: 2021-10-23 — End: 2021-10-23
  Administered 2021-10-23: 180 mg via INTRAVENOUS

## 2021-10-23 MED ORDER — LIDOCAINE HCL (CARDIAC) PF 100 MG/5ML IV SOSY
PREFILLED_SYRINGE | INTRAVENOUS | Status: DC | PRN
  Administered 2021-10-23: 80 mg via INTRAVENOUS

## 2021-10-23 MED ORDER — HYDROMORPHONE HCL 1 MG/ML IJ SOLN
INTRAMUSCULAR | Status: AC
  Filled 2021-10-23: qty 1

## 2021-10-23 MED ORDER — HYDROMORPHONE HCL 2 MG PO TABS: 1.0000 mg | ORAL_TABLET | ORAL | Status: DC | PRN

## 2021-10-23 MED ORDER — PROMETHAZINE HCL 25 MG/ML IJ SOLN: 6.2500 mg | INTRAMUSCULAR | Status: DC | PRN

## 2021-10-23 MED ORDER — KETOROLAC TROMETHAMINE 30 MG/ML IJ SOLN
INTRAMUSCULAR | Status: AC
  Filled 2021-10-23: qty 1

## 2021-10-23 MED ORDER — FENTANYL CITRATE (PF) 100 MCG/2ML IJ SOLN
25.0000 ug | INTRAMUSCULAR | Status: DC | PRN
  Administered 2021-10-23 (×2): 50 ug via INTRAVENOUS

## 2021-10-23 MED ORDER — SCOPOLAMINE 1 MG/3DAYS TD PT72
MEDICATED_PATCH | TRANSDERMAL | Status: DC | PRN
  Administered 2021-10-23: 1 via TRANSDERMAL

## 2021-10-23 MED ORDER — FENTANYL CITRATE (PF) 250 MCG/5ML IJ SOLN
INTRAMUSCULAR | Status: AC
  Filled 2021-10-23: qty 5

## 2021-10-23 MED ORDER — MIDAZOLAM HCL 2 MG/2ML IJ SOLN
INTRAMUSCULAR | Status: AC
  Filled 2021-10-23: qty 2

## 2021-10-23 MED ORDER — DIPHENHYDRAMINE HCL 50 MG/ML IJ SOLN: 12.5000 mg | INTRAMUSCULAR | Status: DC | PRN

## 2021-10-23 MED ORDER — NALOXONE HCL 0.4 MG/ML IJ SOLN: 0.4000 mg | INTRAMUSCULAR | Status: DC | PRN

## 2021-10-23 MED ORDER — DEXAMETHASONE SODIUM PHOSPHATE 10 MG/ML IJ SOLN
INTRAMUSCULAR | Status: DC | PRN
  Administered 2021-10-23: 10 mg via INTRAVENOUS

## 2021-10-23 MED ORDER — ZOLPIDEM TARTRATE 5 MG PO TABS: 5.0000 mg | ORAL_TABLET | Freq: Every evening | ORAL | Status: DC | PRN

## 2021-10-23 MED ORDER — STERILE WATER FOR IRRIGATION IR SOLN
Status: DC | PRN
  Administered 2021-10-23: 1

## 2021-10-23 MED ORDER — MEPERIDINE HCL 25 MG/ML IJ SOLN: 6.2500 mg | INTRAMUSCULAR | Status: DC | PRN

## 2021-10-23 MED ORDER — FENTANYL CITRATE (PF) 100 MCG/2ML IJ SOLN
INTRAMUSCULAR | Status: AC
  Filled 2021-10-23: qty 2

## 2021-10-23 MED ORDER — DIPHENHYDRAMINE HCL 25 MG PO CAPS: 25.0000 mg | ORAL_CAPSULE | Freq: Four times a day (QID) | ORAL | Status: DC | PRN

## 2021-10-23 MED ORDER — OXYTOCIN-SODIUM CHLORIDE 30-0.9 UT/500ML-% IV SOLN
INTRAVENOUS | Status: DC | PRN
  Administered 2021-10-23: 30 [IU] via INTRAVENOUS

## 2021-10-23 MED ORDER — OXYTOCIN-SODIUM CHLORIDE 30-0.9 UT/500ML-% IV SOLN: 2.5000 [IU]/h | INTRAVENOUS | Status: AC

## 2021-10-23 MED ORDER — SIMETHICONE 80 MG PO CHEW
80.0000 mg | CHEWABLE_TABLET | Freq: Three times a day (TID) | ORAL | Status: DC
  Administered 2021-10-23 – 2021-10-25 (×5): 80 mg via ORAL
  Filled 2021-10-23 (×6): qty 1

## 2021-10-23 MED ORDER — ACETAMINOPHEN 10 MG/ML IV SOLN: 1000.0000 mg | Freq: Once | INTRAVENOUS | Status: DC | PRN

## 2021-10-23 MED ORDER — SENNOSIDES-DOCUSATE SODIUM 8.6-50 MG PO TABS
2.0000 | ORAL_TABLET | Freq: Every day | ORAL | Status: DC
  Administered 2021-10-24: 2 via ORAL
  Filled 2021-10-23 (×2): qty 2

## 2021-10-23 MED ORDER — SIMETHICONE 80 MG PO CHEW: 80.0000 mg | CHEWABLE_TABLET | ORAL | Status: DC | PRN

## 2021-10-23 MED ORDER — KETOROLAC TROMETHAMINE 30 MG/ML IJ SOLN
INTRAMUSCULAR | Status: DC | PRN
  Administered 2021-10-23: 30 mg via INTRAVENOUS

## 2021-10-23 MED ORDER — OXYCODONE HCL 5 MG PO TABS
5.0000 mg | ORAL_TABLET | ORAL | Status: DC | PRN
  Administered 2021-10-23 (×2): 5 mg via ORAL
  Filled 2021-10-23 (×2): qty 1

## 2021-10-23 MED ORDER — DIPHENHYDRAMINE HCL 25 MG PO CAPS: 25.0000 mg | ORAL_CAPSULE | ORAL | Status: DC | PRN

## 2021-10-23 MED ORDER — KETOROLAC TROMETHAMINE 30 MG/ML IJ SOLN
30.0000 mg | Freq: Four times a day (QID) | INTRAMUSCULAR | Status: AC
  Administered 2021-10-23 – 2021-10-24 (×4): 30 mg via INTRAVENOUS
  Filled 2021-10-23 (×4): qty 1

## 2021-10-23 MED ORDER — PRENATAL MULTIVITAMIN CH
1.0000 | ORAL_TABLET | Freq: Every day | ORAL | Status: DC
  Administered 2021-10-23 – 2021-10-24 (×2): 1 via ORAL
  Filled 2021-10-23 (×3): qty 1

## 2021-10-23 MED ORDER — SODIUM CHLORIDE 0.9 % IR SOLN
Status: DC | PRN
  Administered 2021-10-23: 1

## 2021-10-23 MED ORDER — METHYLERGONOVINE MALEATE 0.2 MG/ML IJ SOLN
INTRAMUSCULAR | Status: DC | PRN
Start: 2021-10-23 — End: 2021-10-23
  Administered 2021-10-23: .2 mg via INTRAMUSCULAR

## 2021-10-23 MED ORDER — SODIUM CHLORIDE 0.9% FLUSH: 3.0000 mL | INTRAVENOUS | Status: DC | PRN

## 2021-10-23 MED ORDER — ACETAMINOPHEN 500 MG PO TABS
1000.0000 mg | ORAL_TABLET | Freq: Four times a day (QID) | ORAL | Status: DC
  Administered 2021-10-23 – 2021-10-25 (×7): 1000 mg via ORAL
  Filled 2021-10-23 (×10): qty 2

## 2021-10-23 MED ORDER — OXYCODONE HCL 5 MG PO TABS: 5.0000 mg | ORAL_TABLET | Freq: Four times a day (QID) | ORAL | Status: DC | PRN

## 2021-10-23 MED ORDER — OXYTOCIN-SODIUM CHLORIDE 30-0.9 UT/500ML-% IV SOLN
INTRAVENOUS | Status: AC
  Filled 2021-10-23: qty 500

## 2021-10-23 MED ORDER — SUCCINYLCHOLINE CHLORIDE 200 MG/10ML IV SOSY
PREFILLED_SYRINGE | INTRAVENOUS | Status: AC
  Filled 2021-10-23: qty 10

## 2021-10-23 MED ORDER — NALOXONE HCL 4 MG/10ML IJ SOLN
1.0000 ug/kg/h | INTRAVENOUS | Status: DC | PRN
  Filled 2021-10-23: qty 5

## 2021-10-23 MED ORDER — SODIUM CHLORIDE 0.9 % IV SOLN: INTRAVENOUS | Status: DC | PRN

## 2021-10-23 MED ORDER — OXYCODONE HCL 5 MG PO TABS: 5.0000 mg | ORAL_TABLET | Freq: Once | ORAL | Status: DC | PRN

## 2021-10-23 MED ORDER — IBUPROFEN 600 MG PO TABS
600.0000 mg | ORAL_TABLET | Freq: Four times a day (QID) | ORAL | Status: DC
  Administered 2021-10-24 – 2021-10-25 (×3): 600 mg via ORAL
  Filled 2021-10-23 (×6): qty 1

## 2021-10-23 MED ORDER — COCONUT OIL OIL: 1.0000 "application " | TOPICAL_OIL | Status: DC | PRN

## 2021-10-23 MED ORDER — LACTATED RINGERS IV SOLN: INTRAVENOUS | Status: DC | PRN

## 2021-10-23 MED ORDER — ONDANSETRON HCL 4 MG/2ML IJ SOLN
INTRAMUSCULAR | Status: DC | PRN
  Administered 2021-10-23: 4 mg via INTRAVENOUS

## 2021-10-23 MED ORDER — PROPOFOL 10 MG/ML IV BOLUS
INTRAVENOUS | Status: DC | PRN
  Administered 2021-10-23: 200 mg via INTRAVENOUS

## 2021-10-23 MED ORDER — HYDROMORPHONE HCL 1 MG/ML IJ SOLN
INTRAMUSCULAR | Status: DC | PRN
  Administered 2021-10-23: 1 mg via INTRAVENOUS

## 2021-10-23 SURGICAL SUPPLY — 41 items
CHLORAPREP W/TINT 26ML (MISCELLANEOUS) ×2 IMPLANT
CLAMP CORD UMBIL (MISCELLANEOUS) IMPLANT
CLOTH BEACON ORANGE TIMEOUT ST (SAFETY) ×2 IMPLANT
DERMABOND ADHESIVE PROPEN (GAUZE/BANDAGES/DRESSINGS) ×1
DERMABOND ADVANCED (GAUZE/BANDAGES/DRESSINGS) ×1
DERMABOND ADVANCED .7 DNX12 (GAUZE/BANDAGES/DRESSINGS) ×1 IMPLANT
DERMABOND ADVANCED .7 DNX6 (GAUZE/BANDAGES/DRESSINGS) IMPLANT
DRSG OPSITE POSTOP 4X10 (GAUZE/BANDAGES/DRESSINGS) ×2 IMPLANT
ELECT REM PT RETURN 9FT ADLT (ELECTROSURGICAL) ×2
ELECTRODE REM PT RTRN 9FT ADLT (ELECTROSURGICAL) ×1 IMPLANT
EXTRACTOR VACUUM BELL STYLE (SUCTIONS) IMPLANT
GAUZE SPONGE 4X4 12PLY STRL LF (GAUZE/BANDAGES/DRESSINGS) IMPLANT
GLOVE BIO SURGEON STRL SZ 6 (GLOVE) ×2 IMPLANT
GLOVE BIOGEL PI IND STRL 6.5 (GLOVE) ×1 IMPLANT
GLOVE BIOGEL PI IND STRL 7.0 (GLOVE) ×2 IMPLANT
GLOVE BIOGEL PI INDICATOR 6.5 (GLOVE) ×1
GLOVE BIOGEL PI INDICATOR 7.0 (GLOVE) ×2
GOWN STRL REUS W/TWL LRG LVL3 (GOWN DISPOSABLE) ×4 IMPLANT
HEMOSTAT ARISTA ABSORB 3G PWDR (HEMOSTASIS) ×1 IMPLANT
KIT ABG SYR 3ML LUER SLIP (SYRINGE) IMPLANT
NDL HYPO 25X5/8 SAFETYGLIDE (NEEDLE) IMPLANT
NEEDLE HYPO 25X5/8 SAFETYGLIDE (NEEDLE) IMPLANT
NS IRRIG 1000ML POUR BTL (IV SOLUTION) ×2 IMPLANT
PACK C SECTION WH (CUSTOM PROCEDURE TRAY) ×2 IMPLANT
PAD ABD 8X10 STRL (GAUZE/BANDAGES/DRESSINGS) IMPLANT
PAD OB MATERNITY 4.3X12.25 (PERSONAL CARE ITEMS) ×2 IMPLANT
PENCIL SMOKE EVAC W/HOLSTER (ELECTROSURGICAL) ×2 IMPLANT
RTRCTR C-SECT PINK 25CM LRG (MISCELLANEOUS) IMPLANT
SUT PDS AB 0 CTX 36 PDP370T (SUTURE) IMPLANT
SUT PLAIN 2 0 (SUTURE)
SUT PLAIN 2 0 XLH (SUTURE) ×1 IMPLANT
SUT PLAIN ABS 2-0 CT1 27XMFL (SUTURE) IMPLANT
SUT VIC AB 0 CT1 36 (SUTURE) ×4 IMPLANT
SUT VIC AB 2-0 CT1 27 (SUTURE) ×1
SUT VIC AB 2-0 CT1 TAPERPNT 27 (SUTURE) ×1 IMPLANT
SUT VIC AB 3-0 SH 27 (SUTURE)
SUT VIC AB 3-0 SH 27X BRD (SUTURE) IMPLANT
SUT VIC AB 4-0 KS 27 (SUTURE) ×2 IMPLANT
TOWEL OR 17X24 6PK STRL BLUE (TOWEL DISPOSABLE) ×2 IMPLANT
TRAY FOLEY W/BAG SLVR 14FR LF (SET/KITS/TRAYS/PACK) ×2 IMPLANT
WATER STERILE IRR 1000ML POUR (IV SOLUTION) ×2 IMPLANT

## 2021-10-23 NOTE — Progress Notes (Signed)
Presented to bedside. Reviewed plan again for PLTCS, anesthesia planned as general. All questions answered. FOB and doula aware we will update them in PACU Babies on monitor, Baby A with a few variables BP 101/72 (BP Location: Left Arm)    Pulse 81    Temp 98.3 F (36.8 C) (Oral)    Resp 17    Ht 5\' 10"  (1.778 m)    Wt 112.2 kg Comment: most recent   LMP 04/25/2021    SpO2 96%    BMI 35.49 kg/m

## 2021-10-23 NOTE — Op Note (Addendum)
C-Section Operative Note  Date: 10/23/21  Preoperative Diagnosis: di-di-TIUP @ 27 6/7, preterm cervical dilation with failed [employer] rescue cerclage Postoperative Diagnosis: same as above Procedure: stat PLTCS Indications: preterm labor, scoliosis surgical history preventing adequate epidural/spinal placement Findings:  A: Viable female infant weighing 970g, APGARS pending NICU but with movement and breathing attempts at birth.  B: Viable female infant weighing 1060g, APGARS pending NICU but with movement and breathing attempts at birth. Normal appearing uterus, bilateral fallopian tubes; bilateral streak ovaries c/w POI history. Specimens: di-di-TIUP to pathology, calcifications evident, umbilical clamp placed on cord A EBL 950cc IVF 1500 UOP 400  Patient Course: Patient admitted with preterm cervical dilation in setting of rescue cerclage to r/o PTL. Completed BMTZ 2/9-10. CE stable at 3-4cm however on afternoon of 2/13, cerclage spontaneously expelled. Patient then began laboring over course of day with well applied fetal vertex, making change from 5 to 7cm. mgSO4 for CP ppx initiated. Patient with significant scoliosis surgical history s/p harrington rods with concern that there is no adequate epidural/spinal space to obtain anesthesia; pt declining unmedicated birth. Decision made to move forward with controlled stat PLTCS under general anesthesia for this reason. VTX/VTX presentation, NICU and anesthesia notified appropriately.   Consent:  R/B/A of cesarean section discussed with patient. Alternative would be vaginal delivery which would mean shorter postpartum stay and decreased risk of bleeding. Risks of section include infection of the uterus, pelvic organs, or skin, inadvertent injury to internal organs, such as bowel or bladder. If there is major injury, extensive surgery may be required. If injury is minor, it may be treated with relative ease. Discussed possibility of excessive blood  loss and transfusion. If bleeding cannot be controlled using medical or minor surgical methods, a cesarean hysterectomy may be performed which would mean no future fertility. Patient accepts the possibility of blood transfusion, if necessary. Patient understands and agrees to move forward with section.   Operative Procedure: Patient was taken to the operating room where general anesthesia by Dr Donavan Foil was found to be adequate. She was prepped and draped in the normal sterile fashion in the dorsal supine position with a leftward tilt. An appropriate time out was performed. A Pfannenstiel skin incision was then made with the scalpel and carried through to the underlying layer of fascia by sharp dissection. The fascia was nicked in the midline and the incision was extended laterally bluntly. The superior and inferior aspects of rectus fascia were dissected bluntly off of rectus bellies. Rectus muscles were separated in the midline and the peritoneal cavity entered bluntly. The peritoneal incision was then extended both superiorly and inferiorly with careful attention to avoid both bowel and bladder. The Alexis self-retaining wound retractor was then placed within the incision and the lower uterine segment exposed. The lower uterine segment was then incised in a low transverse fashion and the cavity itself entered bluntly. The incision was extended bluntly. Amniotic sac was ruptured and fluid was noted to be clear in color. Baby A's head was then lifted and delivered from the incision without difficulty using the standard movements. The remainder of the infant delivered and the cord clamped and cut as well. The infant was handed off to NICU. Similar process used to deliver baby B. The placenta was then spontaneously expressed from the uterus and the uterus cleared of all clots and debris with moist lap sponge. Cord A marked with umbilical clamp and passed off field. Attempts made to obtain cord gases unsuccessful. The  uterine incision  was then repaired in 2 layers the first layer was a running locked layer of 0-vicryl and the second an imbricating layer of the same suture. The tubes and ovaries were inspected and the gutters cleared of all clots and debris. Intermittent atony noted which improved with massage and IM methergine. The uterine incision was inspected and found to be hemostatic after application of ARista. All instruments and sponges as well as the Alexis retractor were then removed from the abdomen. The fascia was then closed with 0 Vicryl in a running fashion. Subcutaneous tissue was reapproximated with 3-0 plain in a running fashion. The skin was closed with a subcuticular stitch of 4-0 Vicryl on a Keith needle and then reinforced with Dermabond and a Honeycomb dressing. At the conclusion of the procedure all instruments and sponge counts were correct. Patient was taken to the recovery room in good condition.  NICU states both babies are doing well in NICU currently, CPAP x2 and lines being placed on baby A  Incision time 0222 Baby A 0222 Baby B 0223

## 2021-10-23 NOTE — Transfer of Care (Signed)
Immediate Anesthesia Transfer of Care Note  Patient: Makayla [name]  Procedure(s) Performed: CESAREAN SECTION  Patient Location: PACU  Anesthesia Type:General  Level of Consciousness: awake, alert  and oriented  Airway & Oxygen Therapy: Patient Spontanous Breathing and Patient connected to face mask oxygen  Post-op Assessment: Report given to RN, Post -op Vital signs reviewed and stable and Patient moving all extremities X 4  Post vital signs: Reviewed and stable  Last Vitals:  Vitals Value Taken Time  BP 149/102 10/23/21 0330  Temp    Pulse 76 10/23/21 0338  Resp 15 10/23/21 0338  SpO2 94 % 10/23/21 0338  Vitals shown include unvalidated device data.  Last Pain:  Vitals:   10/22/21 2100  TempSrc: Oral  PainSc: 0-No pain         Complications: No notable events documented.

## 2021-10-23 NOTE — Lactation Note (Signed)
This note was copied from a baby's chart.  NICU Lactation Consultation Note  Patient Name: Makayla [name] [id number] Date: 10/23/2021 Age:34 hours   Subjective Reason for consult: Initial assessment; NICU baby; Multiple gestation RN set up pump in room and mother has initiated pumping. LC reinforced ed pumping education. Mother requests stork pump referral: RN placed order.  Mother denies hx breast surgery/trauma and recalls + breast changes in early pregnancy.   Objective Infant data: Mother's Current Feeding Choice: Breast Milk   Maternal data: G1P0  C-Section, Low Transverse Significant Breast History:: + breast changes during pregnancy  Does the patient have breastfeeding experience prior to this delivery?: No    Assessment Infant: Feeding Status: NPO  Maternal:  Intervention/Plan Interventions: Education; "The NICU and Your Baby" book; [employer] brochure; Infant Driven Feeding Algorithm education  Tools: Pump Pump Education: Setup, frequency, and cleaning; Milk Storage  Plan: Consult Status: Follow-up  NICU Follow-up type: New admission follow up; Verify absence of engorgement; Maternal D/C visit; Verify onset of copious milk (verify receipt of stork pump)  Mother to pump q 3 and bring any EBM to NICU.  HE not discussed at this visit by this LC. Instruction to be provided at next contact.  Elder Negus 10/23/2021, 4:03 PM

## 2021-10-23 NOTE — Anesthesia Procedure Notes (Signed)
Procedure Name: Intubation Date/Time: 10/23/2021 2:20 AM Performed by: Lyda Jester, CRNA Pre-anesthesia Checklist: Patient identified, Patient being monitored, Timeout performed, Emergency Drugs available and Suction available Patient Re-evaluated:Patient Re-evaluated prior to induction Oxygen Delivery Method: Circle System Utilized Preoxygenation: Pre-oxygenation with 100% oxygen Induction Type: IV induction Ventilation: Mask ventilation without difficulty Laryngoscope Size: Glidescope and 3 Grade View: Grade I Tube type: Oral Tube size: 7.0 mm Number of attempts: 1 Airway Equipment and Method: stylet Placement Confirmation: ETT inserted through vocal cords under direct vision, positive ETCO2 and breath sounds checked- equal and bilateral Secured at: 21 cm Tube secured with: Tape Dental Injury: Teeth and Oropharynx as per pre-operative assessment

## 2021-10-23 NOTE — Progress Notes (Signed)
Pt set up with breast pump. Education initiated. Pt verbalized an understanding of breast pump use. Will continue to monitor. Carmelina Dane, RN

## 2021-10-23 NOTE — Anesthesia Postprocedure Evaluation (Signed)
Anesthesia Post Note  Patient: Makayla [name]  Procedure(s) Performed: [place]     Patient location during evaluation: PACU Anesthesia Type: General Level of consciousness: awake Pain management: pain level controlled Vital Signs Assessment: post-procedure vital signs reviewed and stable Respiratory status: spontaneous breathing and respiratory function stable Cardiovascular status: stable Postop Assessment: no apparent nausea or vomiting Anesthetic complications: no   No notable events documented.  Last Vitals:  Vitals:   10/23/21 0430 10/23/21 0455  BP: (!) 135/98 128/89  Pulse: 71 74  Resp: (!) 21 18  Temp:  36.7 C  SpO2: 93% 94%    Last Pain:  Vitals:   10/23/21 0455  TempSrc: Oral  PainSc:    Pain Goal:                   Makayla [name]

## 2021-10-23 NOTE — Progress Notes (Signed)
Subjective: Postpartum Day 0: Cesarean Delivery Makayla [name] is doing well this morning. She is exhausted from the events of the evening. Incisional pain is present but is controlled with pain regimen. She has not yet ambulated, foley in place. She has an appetite for breakfast this morning. No nausea or vomiting.   Babies in NICU, doing well per parents  Objective: Patient Vitals for the past 24 hrs:  BP Temp Temp src Pulse Resp SpO2  10/23/21 0821 (!) 130/91 98 F (36.7 C) Oral 72 16 98 %  10/23/21 0634 131/81 -- -- 79 17 98 %  10/23/21 0455 128/89 98.1 F (36.7 C) Oral 74 18 94 %  10/23/21 0430 (!) 135/98 -- -- 71 (!) 21 93 %  10/23/21 0415 (!) 131/95 -- -- 72 20 94 %  10/23/21 0400 (!) 134/92 98.4 F (36.9 C) Oral 76 16 (!) 87 %  10/23/21 0345 (!) 124/93 -- -- 74 18 92 %  10/23/21 0330 (!) 149/102 (!) 97.2 F (36.2 C) Axillary 79 16 94 %  10/23/21 0101 101/72 -- -- 81 17 96 %  10/23/21 0001 118/81 -- -- 83 17 96 %  10/22/21 2304 119/79 -- -- 89 17 98 %  10/22/21 2201 122/81 -- -- 83 -- 96 %  10/22/21 2125 120/80 -- -- 87 18 95 %  10/22/21 2100 114/82 98.3 F (36.8 C) Oral 92 18 97 %  10/22/21 1953 (!) 146/90 98.6 F (37 C) Oral 89 19 --  10/22/21 1531 127/74 98.3 F (36.8 C) Oral (!) 115 20 95 %    Physical Exam:  General: alert, cooperative, and no distress Lochia: appropriate Uterine Fundus: firm Incision: healing well, no significant drainage, no dehiscence, no significant erythema DVT Evaluation: No evidence of DVT seen on physical exam.  Recent Labs    10/22/21 1938 10/23/21 0531  HGB 11.1* 10.4*  HCT 32.6* 31.4*    Assessment/Plan:  Makayla [name] G1P0 POD#0 sp primary cesarean at  [redacted]w[redacted]d, DCDA twins, under GETA 1. PPC: continue routine PP care - Good UOP, d/c foley when able to ambulate and follow up void - Encourage ambulation 2. Rh pos, rubella immune 3. Elevated blood pressures: few mild range blood pressures around time of delivery, no preeclampsia  symptoms, will continue to monitor vitals   Makayla [name] 10/23/2021, 10:16 AM

## 2021-10-24 ENCOUNTER — Encounter (HOSPITAL_COMMUNITY): Payer: Self-pay | Admitting: Obstetrics and Gynecology

## 2021-10-24 LAB — SURGICAL PATHOLOGY

## 2021-10-24 NOTE — Lactation Note (Signed)
This note was copied from a baby's chart. Lactation Consultation Note  Patient Name: Makayla [name] [id number] Date: 10/24/2021 Reason for consult: Follow-up assessment;Preterm <34wks;Infant < 6lbs;Other (Comment);Primapara;1st time breastfeeding;NICU baby;Multiple gestation (Ovarian Failure, IVF) Age:34 hours  Attempted to see mother 3x on this date. On final visit, mother was resting in bed and had difficulty remaining awake. Mother reports she pumped a few times in the past 24 hrs with drops observed and plans to pump again when waking from nap. Father of baby in room requested information regarding cleaning of pump parts. Stork pump received.   Encouraged mother to increase pumping frequency and expressed the importance of frequent stimulation. Discussed output expectations briefly. Pump cleaning education provided to father of baby and praised for continued support to mother.   Maternal Data Has patient been taught Hand Expression?: No Does the patient have breastfeeding experience prior to this delivery?: No  Feeding Mother's Current Feeding Choice: Breast Milk and Donor Milk (Donor only, mother's milk supply not established yet)  Lactation Tools Discussed/Used Tools: Pump Breast pump type: Double-Electric Breast Pump Pump Education: Setup, frequency, and cleaning Reason for Pumping: Twin infants in NICU Pumping frequency: 3x/24hrs Pumped volume: 0 mL (Reports only drops)  Interventions Interventions: DEBP;Education  Discharge Pump: DEBP;Stork Pump  Plan of Care Encourage mother to pump 8x/24hrs and bring EBM to NICU. HE not discussed. Instruction to be provided at next visit.   Consult Status Consult Status: Follow-up Date: 10/24/21 Follow-up type: In-patient    Makayla [name] 10/24/2021, 4:48 PM

## 2021-10-24 NOTE — Progress Notes (Signed)
Subjective: Postpartum Day 1: Cesarean Delivery Patient reports tolerating PO, + flatus, and no problems voiding.  Her incisional pain has improved since yesterday and she is ambulating more.  She is pumping.  Objective: Vital signs in last 24 hours: Temp:  [98.3 F (36.8 C)-98.6 F (37 C)] 98.3 F (36.8 C) (02/15 0537) Pulse Rate:  [80-90] 90 (02/15 0819) Resp:  [15-18] 15 (02/15 0819) BP: (114-132)/(64-88) 115/85 (02/15 0819) SpO2:  [94 %-99 %] 94 % (02/15 0819)  Physical Exam:  General: alert and cooperative Lochia: appropriate Uterine Fundus: firm Incision: dressing C/D/I   Recent Labs    10/22/21 1938 10/23/21 0531  HGB 11.1* 10.4*  HCT 32.6* 31.4*    Assessment/Plan: Status post Cesarean section. Doing well postoperatively.  Continue current care. Babies stable in NICU, extubated yesterday Had a few elevated BP yesterday but WNL today thus far, will follow  Oliver Pila 10/24/2021, 9:51 AM

## 2021-10-25 MED ORDER — OXYCODONE HCL 5 MG PO TABS: 5.0000 mg | ORAL_TABLET | Freq: Four times a day (QID) | ORAL | 0 refills | Status: AC | PRN

## 2021-10-25 NOTE — Clinical Social Work Maternal (Signed)
CLINICAL SOCIAL WORK MATERNAL/CHILD NOTE  Patient Details  Name: Makayla [name] MRN: [id number] Date of Birth: [birth year]  Date:  10/25/2021  Clinical Social Worker Initiating Note:  Abundio Miu, [place] Date/Time: Initiated:  10/25/21/1325     Child's Name:  Makayla [name]; [facility]   Biological Parents:  Mother, Father (Father: Makayla [name])   Need for Interpreter:  None   Reason for Referral:  Parental Support of Premature Babies < 32 weeks/or Critically Ill babies   Address:  [address] [place] [address]    Phone number:  [phone] (home)    FOB: Makayla [name] [phone]  Additional phone number:   Household Members/Support Persons (HM/SP):   Household Member/Support Person 1   HM/SP Name Relationship DOB or Age  HM/SP -1 [place] FOB/Husband    HM/SP -2        HM/SP -3        HM/SP -4        HM/SP -5        HM/SP -6        HM/SP -7        HM/SP -8          Natural Supports (not living in the home):      Professional Supports: None   Employment: Unemployed   Type of Work:     Education:  Production designer, theatre/television/film   Homebound arranged:    Museum/gallery curator Resources:  Multimedia programmer    Other Resources:      Cultural/Religious Considerations Which May Impact Care:    Strengths:  Ability to meet basic needs  , Understanding of illness   Psychotropic Medications:         Pediatrician:       Pediatrician List:   [place]      Pediatrician Fax Number:    Risk Factors/Current Problems:  Mental Health Concerns     Cognitive State:  Able to Concentrate  , Alert  , Goal Oriented  , Linear Thinking     Mood/Affect:  Tearful  , Flat     CSW Assessment: CSW met with MOB at bedside to complete psychosocial assessment, FOB present. CSW introduced self and explained role. MOB presented with minimal goal oriented speech and  minimal eye contact. FOB was welcoming and participated in assessment. FOB reported that they reside together. Parents reported that they started to shop for infants and have car seats. FOB reported that they have one basinet and MOB shared that they will be getting basinets that attach to the bed. CSW informed parents about [employer]'s [employer] if any assistance is needed obtaining items for infants, FOB reported that assistance with preemie clothing would be helpful. CSW agreed to make a referral. CSW inquired about MOB's support system, MOB reported that her husband/FOB is her support. MOB shared that she doesn't have additional supports locally.   CSW provided review of Sudden Infant Death Syndrome (SIDS) precautions.    CSW and parents discussed infants NICU admissions. MOB became tearful and shared that she had questions regarding infants NICU bills and insurance. CSW acknowledged and validated MOB's feelings surrounding concerns with insurance coverage. CSW explained that infants qualify to apply for SSI benefits and explained that Medicaid is a part of the benefit if they are approved. Parents reported that they were interested in applying, CSW explained application  process and provided information on how to apply. CSW asked if parents were interested in financial counselor following up with them about Medicaid benefits, parents reported yes. CSW agreed to notify financial counselor. CSW inquired about parents feeling informed about infants care, FOB reported that they receive updates when they visit. CSW informed parents about rounds to receive updates and explained how parents can reach out for updates. Parents denied any transportation barriers with visiting infants in the NICU. Parents denied any questions regarding the NICU.   CSW asked FOB to leave the room to speak with MOB privately, FOB left the room.   CSW inquired about MOB's mental health history. MOB denied any  mental health history. Per prenatal records review, MOB has a history of depression. CSW inquired about how MOB was feeling emotionally since giving birth, MOB reported that it has been very difficult and became tearful. CSW acknowledged, validated, and normalized MOB's feelings. CSW and MOB dicussed feelings associated with a NICU admission. CSW provided emotional support and encouraging words. CSW and MOB discussed edinburgh score 12, CSW encouraged MOB to pay close attention to her behaviors/feelings and to reach out to her OB provider if they start to interfere with her daily living activities. CSW assessed for safety, MOB denied SI, HI, and domestic violence.   CSW provided education regarding the baby blues period vs. perinatal mood disorders, discussed treatment and gave resources for mental health follow up if concerns arise.  CSW recommends self-evaluation during the postpartum time period using the New Mom Checklist from Postpartum Progress and encouraged MOB to contact a medical professional if symptoms are noted at any time.    CSW will continue to offer resources/supports while infants are admitted to the NICU. MOB reported that she prefers for FOB to contacted via telephone if they are not present at this time while she is healing, CSW agreed to contact FOB for any follow up.   CSW Advice worker and requested follow up.  CSW made FSN referral for requested items.    CSW Plan/Description:  Sudden Infant Death Syndrome (SIDS) Education, Perinatal Mood and Anxiety Disorder (PMADs) Education, Psychosocial Support and Ongoing Assessment of Needs, Supplemental Security Income (SSI) Information, Other Patient/Family Education    Burnis Medin, LCSW 10/25/2021, 1:38 PM

## 2021-10-25 NOTE — Progress Notes (Signed)
Pt discharged in stable condition after discharge instructions given. All questions answered and pt verbalized understanding. Pt discharged with all belongings.

## 2021-10-25 NOTE — Discharge Summary (Signed)
Postpartum Discharge Summary  Date of Service updated 10/25/21     Patient Name: Makayla [name] DOB: 10-Oct-1985 MRN: [id number]  Date of admission: 10/18/2021 Delivery date:   Makayla [name], Makayla [name] [[id number]]  10/23/2021    Makayla [name], Makayla [name] [[id number]]  10/23/2021  Delivering provider:    Venia Carbon [[id number]]  [facility], [place], [place]aya [[id number]]  [facility], Mickle Asper M  Date of discharge: 10/25/2021  Admitting diagnosis: Cervical insufficiency during pregnancy, antepartum [O34.30] Intrauterine pregnancy: [redacted]w[redacted]d     Secondary diagnosis:  Principal Problem:   Cervical insufficiency during pregnancy, antepartum  Additional problems: Preterm labor, di/di twin IVF twin pregnancy  Discharge diagnosis: Preterm Pregnancy Delivered                                              Post partum procedures: n/a Augmentation: N/A Complications: None  Hospital course: Patient was admitted on 2/9 with concern for preterm labor.  Pregnancy has been complicated by cervical shortening (1.7cm at anatomy scan, follow up scan on 1/5 with MFM shows 1.6cm cervix with shortening to 58mm with transfundal pressure)) which was treated with vaginal progesterone and subsequently a rescue cerclage at 22 weeks.  On 2/9, she presented with increased pelvic pressure and was found to be 3 cm dilated. She was admitted to the antepartum unit.  She received betamethasone, NICU and MFM consultation and was monitored expectantly.  On 2/13, she noted that her cerclage "fell out."  Cervix was initially stable, but that evening she started to labor.  She has a history of scoliosis with rods that prevented an spinal during her cerclage  placement.  She ultimately underwent a primary cesarean section under general anesthesia.  Her postpartum course was unremarkable.  On post operative day #2, she requested discharge to home.  She was ambulating, voiding, tolerating PO and her pain was  controlled  Magnesium Sulfate received: Yes: Neuroprotection BMZ received: Yes   Physical exam  Vitals:   10/24/21 1945 10/24/21 2338 10/25/21 0452 10/25/21 0740  BP: 114/77 (!) 105/59 108/63 126/72  Pulse: 77 82 78 73  Resp: 16 16 16 16   Temp: 98.4 F (36.9 C) 98.1 F (36.7 C) 98 F (36.7 C) 98.3 F (36.8 C)  TempSrc: Oral Oral Oral Oral  SpO2: 100% 99% 100% 100%  Weight:      Height:       General: alert, cooperative, and no distress Lochia: appropriate Uterine Fundus: firm Incision: Healing well with no significant drainage DVT Evaluation: No evidence of DVT seen on physical exam. Labs: Lab Results  Component Value Date   WBC 13.1 (H) 10/23/2021   HGB 10.4 (L) 10/23/2021   HCT 31.4 (L) 10/23/2021   MCV 80.3 10/23/2021   PLT 340 10/23/2021   No flowsheet data found. Edinburgh Score: Edinburgh Postnatal Depression Scale Screening Tool 10/25/2021  I have been able to laugh and see the funny side of things. 0  I have looked forward with enjoyment to things. 0  I have blamed myself unnecessarily when things went wrong. 1  I have been anxious or worried for no good reason. 1  I have felt scared or panicky for no good reason. 0  Things have been getting on top of me. 2  I have been so unhappy that I have had difficulty sleeping. 2  I have  felt sad or miserable. 3  I have been so unhappy that I have been crying. 3  The thought of harming myself has occurred to me. 0  Edinburgh Postnatal Depression Scale Total 12      After visit meds:  Allergies as of 10/25/2021   No Known Allergies      Medication List     STOP taking these medications    aspirin 81 MG chewable tablet   progesterone 200 MG capsule Commonly known as: PROMETRIUM   sodium citrate-citric acid 500-334 MG/5ML solution Commonly known as: ORACIT       TAKE these medications    docusate sodium 100 MG capsule Commonly known as: COLACE Take 200 mg by mouth 2 (two) times daily.    multivitamin-prenatal 27-0.8 MG Tabs tablet Take 1 tablet by mouth daily at 12 noon.   oxyCODONE 5 MG immediate release tablet Commonly known as: Oxy IR/ROXICODONE Take 1 tablet (5 mg total) by mouth every 6 (six) hours as needed for severe pain.               Durable Medical Equipment  (From admission, onward)           Start     Ordered   10/23/21 1548  For home use only DME double electric breast pump  Once       Comments: Z39.1   10/23/21 1548              Discharge Care Instructions  (From admission, onward)           Start     Ordered   10/25/21 0000  No dressing needed        10/25/21 0859             Discharge home in stable condition Infant Feeding:  pumping Infant Disposition:NICU Discharge instruction: per After Visit Summary and Postpartum booklet. Activity: Advance as tolerated. Pelvic rest for 6 weeks.  Diet: routine diet Anticipated Birth Control: Unsure Postpartum Appointment:2 weeks  Future Appointments:No future appointments. Follow up Visit:  Follow-up Information     [facility] Follow up in 2 week(s).   Contact information: [address]  [address] [place] [address] [phone]                     10/25/2021 [facility] GEFFEL Chestine Spore, MD

## 2021-10-25 NOTE — Lactation Note (Signed)
This note was copied from a baby's chart.  NICU Lactation Consultation Note  Patient Name: Makayla [name] OACZY'S Date: 10/25/2021 Age:34 hours   Subjective Reason for consult: Follow-up assessment; NICU baby; Preterm <34wks; Primapara; 1st time breastfeeding  Lactation followed up with [name]. She is preparing for discharge today. We discussed pumping frequency, and [name] states that she hopes it will be easier to pump q3 hours once she's home. Verified that she has a personal pump, and bagged up DEBP supplies to take to NICU. She needs labels for breast milk, and I stated that I would ask her NICU RN to provide them.  [name] was tearful in this visit, and states that the feelings are due to her situation. I tried to provide reassurance and reviewed our contact resources in the event that her milk transitions overnight, and she has any questions.  Objective Infant data: Mother's Current Feeding Choice: Breast Milk and Donor Milk  Maternal data: G1P0102  C-Section, Low Transverse Current breast feeding challenges:: NICU; preterm  Does the patient have breastfeeding experience prior to this delivery?: No  Pumping frequency: 4 times in 24 hours; recommended q3 hours Pumped volume: 0 mL Flange Size: 27   Pump: Stork Pump, DEBP  Assessment Maternal: Milk volume: Normal   Intervention/Plan Interventions: Breast feeding basics reviewed; [employer] brochure; Education  Recommended that she pump both breasts q3 hours.  Tools: Pump; Flanges Pump Education: Setup, frequency, and cleaning  Plan: Consult Status: Follow-up  NICU Follow-up type: Verify onset of copious milk; Verify absence of engorgement; Weekly NICU follow up    Walker Shadow 10/25/2021, 11:21 AM

## 2021-10-25 NOTE — Progress Notes (Signed)
Subjective: Postpartum Day 2: Cesarean Delivery Patient reports tolerating PO, + flatus, and no problems voiding.  Her pain is well controlled.  She has only required one dose of oxycodone.  She is pumping.  Had a rare mildly elevated diastolic on PPD #0, all normal yesterday.  She requests discharge to home today  Objective: Vital signs in last 24 hours: Temp:  [98 F (36.7 C)-98.9 F (37.2 C)] 98.3 F (36.8 C) (02/16 0740) Pulse Rate:  [73-85] 73 (02/16 0740) Resp:  [15-17] 16 (02/16 0740) BP: (96-126)/(43-77) 126/72 (02/16 0740) SpO2:  [96 %-100 %] 100 % (02/16 0740)  Physical Exam:  General: alert and cooperative Lochia: appropriate Uterine Fundus: firm Incision: dressing C/D/I   Recent Labs    10/22/21 1938 10/23/21 0531  HGB 11.1* 10.4*  HCT 32.6* 31.4*    Assessment/Plan: Status post Cesarean section. Doing well postoperatively.  Babies stable in NICU, Per mom Meeting all goals--requests discharge [facility] GEFFEL Jahni Nazar 10/25/2021, 8:53 AM

## 2021-10-30 ENCOUNTER — Ambulatory Visit: Payer: Self-pay

## 2021-10-30 NOTE — Lactation Note (Signed)
This note was copied from a baby's chart. Lactation Consultation Note  Patient Name: Makayla [name] [id number] Date: 10/30/2021 Reason for consult: Follow-up assessment;NICU baby;Multiple gestation;1st time breastfeeding;Primapara;Infant < 6lbs;Preterm <34wks;Other (Comment) (ovarian failure, IVF) Age:34 days  Visited with mom of 29 days old pre-term NICU twins, she's a P1 and reports that her milk is slowly coming in. Mom is not pumping consistently, stressed to her the importance of consistent pumping to protect her supply. When inquire about her feeding plan for babies, she voiced she wasn't sure, explained to her that if she keeps pumping only a few times a day will supply will start dwindling but if she wanted to work on an exclusive breastmilk diet, she can start pumping more often and also add power pumping.  Mom had a question regarding the milk lab, they left a note for her; she said she's been bringing her milk into bags but she was told to use our bottles instead. Explained to mom that is the milk lab recommendation and that we'll provide all bottles and labels needed, NICU RN Hilaria Ota already got bottles in the room.  Maternal Data  Mom's supply is WNL  Feeding Mother's Current Feeding Choice: Breast Milk and Donor Milk  Lactation Tools Discussed/Used Tools: Pump;Flanges Flange Size: 27 Breast pump type: Double-Electric Breast Pump Pump Education: Setup, frequency, and cleaning;Milk Storage Reason for Pumping: pre-term twins in NICU Pumping frequency: 5-6 times/24 hours Pumped volume: 30 mL (30-45 ml)  Interventions Interventions: Breast feeding basics reviewed;DEBP;Education  Plan of Care Encourage mother to pump every 3 hours, at least 8 times/24hrs  Power pumping in the AM was also encouraged  FOB present and very supportive, he was doing STS with one of the twins. All questions and concerns answered, parents to call NICU LC PRN.  Discharge Pump: DEBP;Stork  Pump  Consult Status Consult Status: Follow-up Date: 10/30/21 Follow-up type: In-patient   Makayla [name] 10/30/2021, 12:13 PM

## 2021-11-03 ENCOUNTER — Ambulatory Visit: Payer: Self-pay

## 2021-11-03 NOTE — Lactation Note (Signed)
This note was copied from a baby's chart.  NICU Lactation Consultation Note  Patient Name: Cailin Gebel WUJWJ'[id number] Date: 11/03/2021 Age:36 years   Subjective Reason for consult: Follow-up assessment Mother continues to pump frequently. She was pleased to see an increase in volume today. We reviewed volume norms and pumping strategies.  Objective Infant data: Mother's Current Feeding Choice: Breast Milk  Infant feeding assessment Scale for Readiness: 1    Maternal data: G1P0102  C-Section, Low Transverse  Pumping frequency: q3h. Last pumping was 11mL. Pumped volume: 45 mL   Pump: DEBP, Stork Pump  Assessment Maternal: Milk volume: Normal Milk supply is increasing as expected  Intervention/Plan Interventions: Education  Plan: Consult Status: Follow-up  NICU Follow-up type: Weekly NICU follow up  Mother to continue pumping q2h  Elder Negus 11/03/2021, 3:33 PM

## 2021-11-07 ENCOUNTER — Ambulatory Visit: Payer: Self-pay

## 2021-11-07 NOTE — Lactation Note (Signed)
This note was copied from a baby's chart. ? ?NICU Lactation Consultation Note ? ?Patient Name: Makayla [name] ?Today's Date: 11/07/2021 ?Age:34 wk.o. ? ? ?Subjective ?Reason for consult: Follow-up assessment ?Mother continues to pump frequently and with increasing volumes. She endorses nipple soreness and lanolin use. We reviewed strategies to decrease discomfort. I also provided additional milk collection bottles at mom's request.  ? ?Objective ?Infant data: ?Mother's Current Feeding Choice: Breast Milk ? ?Infant feeding assessment ?Scale for Readiness: 3 ? ?  ?Maternal data: ?[id number]  ?C-Section, Low Transverse ?Pumping frequency: 9xday ?Pumped volume: 45 mL ? ? ?Pump: DEBP, Stork Pump ? ?Assessment ?Maternal: ?Milk volume: Normal ? ? ?Intervention/Plan ?Interventions: Education ? ?Plan: ?Consult Status: Follow-up ? ?NICU Follow-up type: Weekly NICU follow up ? ?Mother to continue current pumping poc. ? ?Elder Negus ?11/07/2021, 4:02 PM ?

## 2021-11-08 ENCOUNTER — Encounter (HOSPITAL_COMMUNITY): Payer: Self-pay | Admitting: Obstetrics and Gynecology

## 2021-11-19 ENCOUNTER — Ambulatory Visit: Payer: Self-pay

## 2021-11-19 NOTE — Lactation Note (Signed)
This note was copied from a baby's chart. ?Lactation Consultation Note ? ?Patient Name: Makayla [name] ?Today's Date: 11/19/2021 ?Reason for consult: Follow-up assessment ?Age:34 wk.o. ? ?Lactation conducted a brief visit with [name]. [employer] was in room with [name]. [name] indicated that she would like to have a [facility] referral, and she is interested in a [facility] Symphony pump. I agreed to place a referral. ? ?Lactation follow up regarding WIC pump this week is recommended.  ? ?Feeding ?Mother's Current Feeding Choice: Breast Milk ? ? ?Lactation Tools Discussed/Used ?Breast pump type: Double-Electric Breast Pump ? ?Interventions ?  ? ?Discharge ?[place] Program: No ? ?Consult Status ?Consult Status: Follow-up ?Date: 11/19/21 ?Follow-up type: In-patient ? ? ? ?Lenore Manner ?11/19/2021, 4:02 PM ? ? ? ?

## 2021-11-20 ENCOUNTER — Ambulatory Visit: Payer: Self-pay

## 2021-11-20 NOTE — Lactation Note (Signed)
This note was copied from a baby's chart. ?Lactation Consultation Note ?Mother declined LC visit today. Will plan f/u prn.  ? ?Patient Name: Makayla [name] ?Today's Date: 11/20/2021 ?  ?Age:34 wk.o. ? ? ?Makayla [name] ?11/20/2021, 5:01 PM ? ? ? ?

## 2021-11-22 ENCOUNTER — Ambulatory Visit: Payer: Self-pay

## 2021-11-22 NOTE — Lactation Note (Signed)
This note was copied from a baby's chart. ? ?NICU Lactation Consultation Note ? ?Patient Name: Makayla [name] ?Today's Date: 11/22/2021 ?Age:34 wk.o. ? ? ?Subjective ?Reason for consult: Follow-up assessment; NICU baby; Preterm <34wks; Multiple gestation ? ?Lactation followed up with Ms. Fiallo. She states that [facility] has followed up with her since I placed a referral, and she has an appointment this Monday 3/20. She is currently rooming in, and she is pumping q3 hours consistently. ? ?Ms. Resendiz stated that her babies' intake needs are beginning to exceed her milk volume. I recommended that we do a pumping consult to see if we could find some strategies to support increased milk volume. She stated that she could do that next week (week of 3/20).  ? ?Objective ?Infant data: ?Mother's Current Feeding Choice: Breast Milk ? ?Infant feeding assessment ?  ?Maternal data: ?[id number]  ?C-Section, Low Transverse ?Current breast feeding challenges:: NICU ? ?Pumping frequency: q3 hours ?Pumped volume: 60 mL ? ? ?WIC Program: Yes ?[place] Referral Sent?: Yes ?Pump:  (Referred to [facility]) ? ?Assessment ? ?Intervention/Plan ?Interventions: Education ? ?Tools: Pump ? ?Plan: ?Consult Status: Follow-up ? ?NICU Follow-up type: Weekly NICU follow up ? ? ? ?Lenore Manner ?11/22/2021, 4:19 PM ?

## 2021-11-30 ENCOUNTER — Ambulatory Visit: Payer: Self-pay

## 2021-11-30 NOTE — Lactation Note (Signed)
This note was copied from a baby's chart. ? ?NICU Lactation Consultation Note ? ?Patient Name: Makayla [name] ?Today's Date: 11/30/2021 ?Age:36 ? ? ?Subjective ?Reason for consult: Follow-up assessment ?Mother is pumping frequently. She recently obtained a symphony pump from [facility]. We reviewed volume norms. ? ?Mother plans to pump and bottle feed only.  ? ?Objective ?Infant data: ?Mother's Current Feeding Choice: Breast Milk ? ?Infant feeding assessment ?Scale for Readiness: 2 ? ? ?  ?Maternal data: ?[id number]  ?C-Section, Low Transverse ?Pumping frequency: 10xday (60+mls) ? ? ?WIC Program: No ?WIC Referral Sent?: Yes ?Pump: DEBP, Stork Pump ? ?Assessment ?Maternal: ?Milk volume: Normal ? ? ?Intervention/Plan ?Interventions: Education ? ?Plan: ?Consult Status: Follow-up ? ?NICU Follow-up type: Weekly NICU follow up ? ? ? ?Elder Negus ?11/30/2021, 2:29 PM ?

## 2021-12-03 ENCOUNTER — Ambulatory Visit: Payer: Self-pay

## 2021-12-03 NOTE — Lactation Note (Signed)
This note was copied from a baby's chart. ?Lactation Consultation Note ? ?Patient Name: Makayla [name] ?Today's Date: 12/03/2021 ?Reason for consult: Other (Comment);L&D Initial assessment;1st time breastfeeding;Primapara;NICU baby;Preterm <34wks;Infant < 6lbs;Multiple gestation (ovaryan failure, IVF) ?Age:34 wk.o. ? ?Visited with mom of 27 weeks old pre-tern NICU twins, she called for [facility] assistance because she was having nipple pain. Ms. Columbia voiced that she's been adjusting the suction on her pump (hospital grade) and last night got "pink milk" on the right breast after turning up the suction, a section of her right nipple came off, breastmilk looked like "rusty pipe" advised mom not to discard it if it were to happen again, she said her husband was the one who threw it away.  ? ?Ms. Liddiard was pumping when entering the room, she voiced a concern regarding the machine not fully emptying the breasts and that's why she increased the suction, her R side has been the lesser producer one. Assisted with hand expression and rubbed some breastmilk on her nipple. NICU RN Rosey Bath. will be bringing coconut oil for mom. Breastshells and comfort gels were provided as well. Reviewed prevention/treatment of sore nipples. ? ?Maternal Data ? Mom's supply is slightly BNL ? ?Feeding ?Mother's Current Feeding Choice: Breast Milk ? ?Lactation Tools Discussed/Used ?Tools: Pump;Flanges;Coconut oil;Shells;Comfort gels ?Flange Size: 27 ?Breast pump type: Double-Electric Breast Pump ?Pump Education: Setup, frequency, and cleaning;Milk Storage ?Reason for Pumping: pre-term twins in NICU ?Pumping frequency: + 8 times/24 hours ?Pumped volume: 75 mL (75-90 ml.) ? ?Interventions ?Interventions: DEBP;Breast massage;Hand express;Coconut oil;Education;Shells;Comfort gels ? ?Plan of Care ?Encourage mother to continue pumping consistently every 3 hours, at least 8 times/24hrs. She'll watch the suction carefully to avoid nipple  injury ?She'll use her EBM/coconut oil with breast shells during the daytime to help her nipples heals and the comfort gels at night ?  ?No other support person at this time. All questions and concerns answered, mom to call NICU LC PRN. ? ?Discharge ?Pump: DEBP;Stork Pump;Personal (WIC pump) ? ?Consult Status ?Consult Status: Follow-up ?Date: 12/03/21 ?Follow-up type: In-patient ? ? ?Anguel Delapena S Terrin Meddaugh ?12/03/2021, 4:09 PM ? ? ? ?

## 2021-12-04 ENCOUNTER — Ambulatory Visit: Payer: Self-pay

## 2021-12-04 NOTE — Lactation Note (Signed)
This note was copied from a baby's chart. ?Lactation Consultation Note ? ?Patient Name: Makayla [name] ?Today's Date: 12/04/2021 ?Reason for consult: Follow-up assessment;1st time breastfeeding;Primapara;NICU baby;Multiple gestation;Preterm <34wks;Infant < 6lbs;Other (Comment) (ovaryan failure, IVF) ?Age:34 wk.o. ? ?Visited with mom of 49 36/80 weeks old (adjusted) NICU twins, mom had requested assistance yesterday due to nipple trauma but when this LC came in the room, she was pumping with the curtain closed and she voiced she didn't wish to talk at this time. ? ?Briefly told mom that she needed to contact her OBGYN office to ask for an [facility] in case her nipple is still not healed; let her know that infection is a concerns when there is an open wound. She voiced she heard LC. Continue current plan of care, Makayla [name] is aware of NICU LC services and will contact PRN. ? ?Feeding ?Mother's Current Feeding Choice: Breast Milk ? ?Lactation Tools Discussed/Used ?Tools: Pump;Flanges;Coconut oil;Comfort gels ?Flange Size: 27 ?Breast pump type: Double-Electric Breast Pump ?Pump Education: Setup, frequency, and cleaning;Milk Storage ?Reason for Pumping: pre-term twins in NICU ?Pumping frequency: + 8 times/24 hours ?Pumped volume: 75 mL (75-90 ml) ? ?Interventions ?Interventions: Education ? ?Discharge ?Pump: DEBP;Personal;Stork Pump (WIC pump) ? ?Consult Status ?Consult Status: Follow-up ?Date: 12/04/21 ?Follow-up type: In-patient ? ? ?Siearra Amberg S Bayla Mcgovern ?12/04/2021, 4:52 PM ? ? ? ?

## 2021-12-12 ENCOUNTER — Ambulatory Visit: Payer: Self-pay

## 2021-12-12 NOTE — Lactation Note (Signed)
This note was copied from a baby's chart. ?Lactation Consultation Note ? ?Patient Name: Makayla [name] ?Today's Date: 12/12/2021 ?Reason for consult: Follow-up assessment;NICU baby;Multiple gestation;Preterm <34wks;Infant < 6lbs ?Age:34 wk.o. ? ?[employer] [place] student in to see mom for brief visit. Mom mentioned she is pumping "when she can" and is getting about an ounce per pump session from both breasts. Mom reports nipple abrasion/wound has healed. ? ?Continue with current plan. ?  ?Consult Status ?Consult Status: Follow-up ?Date: 12/12/21 ?Follow-up type: In-patient ? ? ? ?Jovita Gamma ?12/12/2021, 1:44 PM ? ? ? ?

## 2021-12-20 ENCOUNTER — Ambulatory Visit: Payer: Self-pay

## 2021-12-20 NOTE — Lactation Note (Signed)
This note was copied from a baby's chart. ?Lactation Consultation Note ? ?Patient Name: Makayla [name] ?Today's Date: 12/20/2021 ?Reason for consult: Follow-up assessment;1st time breastfeeding;Primapara;Exclusive pumping and bottle feeding;NICU baby;Late-preterm 34-36.6wks;Multiple gestation;Other (Comment) (ovarian failure, IVF) ?Age:34 wk.o. ? ?Visited with mom of 16 72/75 weeks old (adjusted) NICU twins, she reports that pumping is going well and that her nipple healed on its own without using the [facility]. Asked Ms. Hebb if she was happy with her volumes since she's not pumping consistently. Explained to her that the ideal is to do 8 pumping sessions/24 hours to keep babies on an exclusive breastmilk diet post-discharge; but that if she's planning on supplementing with formula at some point she could pump at her own pace, she voiced understanding. Reviewed pumping frequency, schedule and expectations. ? ?Maternal Data ? Maternal supply is slightly BNL, probably due to decreased pumping ? ?Feeding ?Mother's Current Feeding Choice: Breast Milk ?Nipple Type: Dr. Levert Feinstein Preemie ? ?Lactation Tools Discussed/Used ?Tools: Pump;Flanges ?Flange Size: 27 ?Breast pump type: Double-Electric Breast Pump ?Pump Education: Setup, frequency, and cleaning;Milk Storage ?Reason for Pumping: LPI twins in NICU ?Pumping frequency: 5-6 times/24 hours (she wakes up at night sometimes) ?Pumped volume: 30 mL (30 ml/hour; 24 oz = 720 ml/24 hours) ? ?Interventions ?Interventions: DEBP;Education ? ?Plan of Care ?Encourage mother to continue pumping consistently ideally every 3 hours, but she might go at her own pace ?She'll continue using coconut oil prior pumping for added lubrication ?  ?FOB present. All questions and concerns answered, mom to call NICU LC PRN. ? ?Discharge ?Pump: DEBP;Personal;Stork Pump (WIC pump) ? ?Consult Status ?Consult Status: Follow-up ?Date: 12/20/21 ?Follow-up type: In-patient ? ? ?Veleta Yamamoto S  Kenta Laster ?12/20/2021, 11:38 AM ? ? ? ?

## 2021-12-27 ENCOUNTER — Ambulatory Visit: Payer: Self-pay

## 2021-12-27 NOTE — Lactation Note (Signed)
This note was copied from a baby's chart. ? ?NICU Lactation Consultation Note ? ?Patient Name: Makayla [name] ?Today's Date: 12/27/2021 ?Age:34 m.o. ? ? ?Subjective ?Reason for consult: Follow-up assessment ?FOB was present this morning. Per his recall, mother continues to pump without change in frequency or volume. She received a [facility] loaner pump.  ? ?Mom's previous breast discomfort has improved. FOB will relay to mother that [facility] visited. She is aware of LC services and will request visit this week prn. No charge for this encounter.  ? ?Objective ?Infant data: ?Mother's Current Feeding Choice: Breast Milk ? ?Infant feeding assessment ?Scale for Readiness: 2 ?Scale for Quality: 2 ? ?  ?Maternal data: ?[id number]  ?C-Section, Low Transverse ? ?WIC Program: Yes ?WIC Referral Sent?: Yes ?Pump: DEBP, Personal, Stork Pump (WIC pump) ? ? ?Intervention/Plan ?No data recorded ?Tools: 63F feeding tube / Syringe; Bottle ? ?Plan: ?Consult Status: Follow-up ? ?NICU Follow-up type: Weekly NICU follow up ? ? ? ?Makayla [name] ?12/27/2021, 10:27 AM ?

## 2022-01-01 ENCOUNTER — Ambulatory Visit: Payer: Self-pay

## 2022-01-01 NOTE — Lactation Note (Signed)
This note was copied from a baby's chart. ?Lactation Consultation Note ? ?Patient Name: Makayla [name] ?Today's Date: 01/01/2022 ?  ?Age:34 m.o. ? ? ?  ? ?Feeding ?Nipple Type: Dr. Levert Feinstein Preemie ? ?  ?Subjective  ? ?  ?LC made two attempts to see mother today. Once in the morning and once in the afternoon. Both times FOB present and declined assistance.  ? ?No Charge ? ?  ? ?[name] ?01/01/2022, 2:33 PM ? ? ? ?

## 2022-01-02 ENCOUNTER — Ambulatory Visit: Payer: Self-pay

## 2022-01-02 NOTE — Lactation Note (Signed)
This note was copied from a baby's chart. ? ?NICU Lactation Consultation Note ? ?Patient Name: Makayla [name] ?Today's Date: 01/02/2022 ?Age:34 m.o. ? ? ?Subjective ?Reason for consult: Follow-up assessment ?Mother states she is decreasing pumping/milk supply and transitioning to formula. We reviewed safely decreasing supply to decrease discomfort. Mother expects infants to d/c soon and declines further LC services.  ? ?Objective ?Infant data: ?Mother's Current Feeding Choice: Breast Milk and Formula ? ?Infant feeding assessment ?Scale for Readiness: 2 ?Scale for Quality: 2 ? ?  ?Maternal data: ?[id number]  ?C-Section, Low Transverse ? ?WIC Program: No ?WIC Referral Sent?: Yes ?Pump: DEBP, Personal, Stork Pump (WIC pump) ? ?Assessment ?Maternal: ?Mother's milk supply is diminishing and she is aware of comfort measures during process.  ? ?Intervention/Plan ?Interventions: Education ? ?Plan: ?Consult Status: Complete (mother declined follow up) ? ? ?Elder Negus ?01/02/2022, 1:41 PM ?

## 2023-05-29 ENCOUNTER — Other Ambulatory Visit: Payer: Self-pay
